# Patient Record
Sex: Female | Born: 1970
Health system: Southern US, Community
[De-identification: ages and names within clinical notes are randomized; demographics above are authoritative.]

## PROBLEM LIST (undated history)

## (undated) DIAGNOSIS — Z8371 Family history of colonic polyps: Secondary | ICD-10-CM

## (undated) DIAGNOSIS — Z83719 Family history of colon polyps, unspecified: Secondary | ICD-10-CM

## (undated) DIAGNOSIS — I10 Essential (primary) hypertension: Secondary | ICD-10-CM

## (undated) DIAGNOSIS — Z9889 Other specified postprocedural states: Secondary | ICD-10-CM

## (undated) DIAGNOSIS — O1420 HELLP syndrome (HELLP), unspecified trimester: Secondary | ICD-10-CM

## (undated) DIAGNOSIS — G43009 Migraine without aura, not intractable, without status migrainosus: Principal | ICD-10-CM

## (undated) DIAGNOSIS — K219 Gastro-esophageal reflux disease without esophagitis: Secondary | ICD-10-CM

## (undated) DIAGNOSIS — Z860100 Personal history of colon polyps, unspecified: Secondary | ICD-10-CM

## (undated) DIAGNOSIS — E559 Vitamin D deficiency, unspecified: Secondary | ICD-10-CM

## (undated) DIAGNOSIS — K635 Polyp of colon: Secondary | ICD-10-CM

## (undated) DIAGNOSIS — R112 Nausea with vomiting, unspecified: Secondary | ICD-10-CM

## (undated) HISTORY — DX: Family history of colonic polyps: Z83.71

## (undated) HISTORY — PX: OTHER SURGICAL HISTORY: SHX169

## (undated) HISTORY — DX: Personal history of colon polyps, unspecified: Z86.0100

## (undated) HISTORY — DX: Gastro-esophageal reflux disease without esophagitis: K21.9

## (undated) HISTORY — DX: Essential (primary) hypertension: I10

## (undated) HISTORY — DX: Migraine without aura, not intractable, without status migrainosus: G43.009

## (undated) HISTORY — DX: Vitamin D deficiency, unspecified: E55.9

## (undated) HISTORY — DX: Family history of colon polyps, unspecified: Z83.719

## (undated) HISTORY — DX: HELLP syndrome (HELLP), unspecified trimester: O14.20

## (undated) HISTORY — DX: Polyp of colon: K63.5

## (undated) HISTORY — DX: Personal history of colonic polyps: Z86.010

---

## 1998-03-23 HISTORY — PX: BREAST REDUCTION SURGERY: SHX8

## 1998-05-14 ENCOUNTER — Other Ambulatory Visit: Admission: RE | Admit: 1998-05-14 | Discharge: 1998-05-14 | Payer: Self-pay | Admitting: Plastic Surgery

## 2000-04-27 ENCOUNTER — Other Ambulatory Visit: Admission: RE | Admit: 2000-04-27 | Discharge: 2000-04-27 | Payer: Self-pay | Admitting: Obstetrics and Gynecology

## 2002-12-18 ENCOUNTER — Other Ambulatory Visit: Admission: RE | Admit: 2002-12-18 | Discharge: 2002-12-18 | Payer: Self-pay | Admitting: Gynecology

## 2004-01-04 ENCOUNTER — Other Ambulatory Visit: Admission: RE | Admit: 2004-01-04 | Discharge: 2004-01-04 | Payer: Self-pay | Admitting: Obstetrics and Gynecology

## 2005-04-09 ENCOUNTER — Other Ambulatory Visit: Admission: RE | Admit: 2005-04-09 | Discharge: 2005-04-09 | Payer: Self-pay | Admitting: Obstetrics and Gynecology

## 2005-07-09 ENCOUNTER — Ambulatory Visit: Payer: Self-pay | Admitting: Gastroenterology

## 2006-01-12 ENCOUNTER — Inpatient Hospital Stay (HOSPITAL_COMMUNITY): Admission: AD | Admit: 2006-01-12 | Discharge: 2006-01-12 | Payer: Self-pay | Admitting: Obstetrics and Gynecology

## 2006-02-01 ENCOUNTER — Ambulatory Visit (HOSPITAL_COMMUNITY): Admission: RE | Admit: 2006-02-01 | Discharge: 2006-02-01 | Payer: Self-pay | Admitting: Obstetrics and Gynecology

## 2006-04-13 ENCOUNTER — Other Ambulatory Visit: Admission: RE | Admit: 2006-04-13 | Discharge: 2006-04-13 | Payer: Self-pay | Admitting: Gynecology

## 2006-04-20 ENCOUNTER — Encounter: Admission: RE | Admit: 2006-04-20 | Discharge: 2006-04-20 | Payer: Self-pay | Admitting: Gynecology

## 2006-08-25 ENCOUNTER — Ambulatory Visit (HOSPITAL_COMMUNITY): Admission: RE | Admit: 2006-08-25 | Discharge: 2006-08-25 | Payer: Self-pay | Admitting: Obstetrics and Gynecology

## 2006-08-26 ENCOUNTER — Ambulatory Visit (HOSPITAL_COMMUNITY): Admission: RE | Admit: 2006-08-26 | Discharge: 2006-08-26 | Payer: Self-pay | Admitting: Obstetrics and Gynecology

## 2006-09-15 ENCOUNTER — Ambulatory Visit (HOSPITAL_COMMUNITY): Admission: RE | Admit: 2006-09-15 | Discharge: 2006-09-15 | Payer: Self-pay | Admitting: Obstetrics and Gynecology

## 2006-10-05 ENCOUNTER — Ambulatory Visit (HOSPITAL_COMMUNITY): Admission: RE | Admit: 2006-10-05 | Discharge: 2006-10-05 | Payer: Self-pay | Admitting: Obstetrics and Gynecology

## 2006-10-18 ENCOUNTER — Observation Stay (HOSPITAL_COMMUNITY): Admission: AD | Admit: 2006-10-18 | Discharge: 2006-10-19 | Payer: Self-pay | Admitting: Obstetrics and Gynecology

## 2006-10-19 ENCOUNTER — Encounter: Payer: Self-pay | Admitting: Obstetrics and Gynecology

## 2006-11-08 ENCOUNTER — Inpatient Hospital Stay (HOSPITAL_COMMUNITY): Admission: AD | Admit: 2006-11-08 | Discharge: 2006-11-16 | Payer: Self-pay | Admitting: Obstetrics & Gynecology

## 2006-11-09 ENCOUNTER — Encounter: Payer: Self-pay | Admitting: Obstetrics and Gynecology

## 2006-11-10 ENCOUNTER — Encounter: Payer: Self-pay | Admitting: Obstetrics & Gynecology

## 2006-11-12 ENCOUNTER — Encounter: Payer: Self-pay | Admitting: Obstetrics and Gynecology

## 2006-11-12 ENCOUNTER — Encounter (INDEPENDENT_AMBULATORY_CARE_PROVIDER_SITE_OTHER): Payer: Self-pay | Admitting: Obstetrics and Gynecology

## 2006-11-17 ENCOUNTER — Encounter: Admission: RE | Admit: 2006-11-17 | Discharge: 2006-12-17 | Payer: Self-pay | Admitting: Obstetrics and Gynecology

## 2006-12-18 ENCOUNTER — Encounter: Admission: RE | Admit: 2006-12-18 | Discharge: 2007-01-16 | Payer: Self-pay | Admitting: Obstetrics and Gynecology

## 2007-01-17 ENCOUNTER — Encounter: Admission: RE | Admit: 2007-01-17 | Discharge: 2007-02-16 | Payer: Self-pay | Admitting: Obstetrics and Gynecology

## 2007-02-17 ENCOUNTER — Encounter: Admission: RE | Admit: 2007-02-17 | Discharge: 2007-03-18 | Payer: Self-pay | Admitting: Obstetrics and Gynecology

## 2007-03-19 ENCOUNTER — Encounter: Admission: RE | Admit: 2007-03-19 | Discharge: 2007-04-18 | Payer: Self-pay | Admitting: Obstetrics and Gynecology

## 2007-04-19 ENCOUNTER — Encounter: Admission: RE | Admit: 2007-04-19 | Discharge: 2007-05-19 | Payer: Self-pay | Admitting: Obstetrics and Gynecology

## 2007-05-20 ENCOUNTER — Encounter: Admission: RE | Admit: 2007-05-20 | Discharge: 2007-06-16 | Payer: Self-pay | Admitting: Obstetrics and Gynecology

## 2007-06-17 ENCOUNTER — Encounter: Admission: RE | Admit: 2007-06-17 | Discharge: 2007-06-21 | Payer: Self-pay | Admitting: Obstetrics and Gynecology

## 2008-05-31 ENCOUNTER — Ambulatory Visit: Payer: Self-pay

## 2008-06-18 ENCOUNTER — Ambulatory Visit: Payer: Self-pay | Admitting: Cardiology

## 2008-06-18 ENCOUNTER — Encounter: Payer: Self-pay | Admitting: Cardiology

## 2008-06-18 DIAGNOSIS — R002 Palpitations: Secondary | ICD-10-CM

## 2008-06-18 DIAGNOSIS — I10 Essential (primary) hypertension: Secondary | ICD-10-CM | POA: Insufficient documentation

## 2009-01-29 ENCOUNTER — Ambulatory Visit (HOSPITAL_COMMUNITY): Admission: RE | Admit: 2009-01-29 | Discharge: 2009-01-29 | Payer: Self-pay | Admitting: Gynecology

## 2009-03-21 ENCOUNTER — Encounter: Admission: RE | Admit: 2009-03-21 | Discharge: 2009-03-21 | Payer: Self-pay | Admitting: Obstetrics and Gynecology

## 2010-02-12 ENCOUNTER — Ambulatory Visit (HOSPITAL_COMMUNITY)
Admission: RE | Admit: 2010-02-12 | Discharge: 2010-02-12 | Payer: Self-pay | Source: Home / Self Care | Admitting: Obstetrics and Gynecology

## 2010-08-05 NOTE — H&P (Signed)
NAMEALEXSIS, KATHMAN               ACCOUNT NO.:  [id number]   MEDICAL RECORD NO.:  [id number]          PATIENT TYPE:  INP   LOCATION:  9154                          FACILITY:  [facility]   PHYSICIAN:  Dineen Kid. Rana Snare, M.D.    DATE OF BIRTH:  Nov 09, 1970   DATE OF ADMISSION:  11/08/2006  DATE OF DISCHARGE:                              HISTORY & PHYSICAL   HISTORY OF PRESENT ILLNESS:  Ms. Schey is a 40 year old G1 at 59 5/7  weeks with estimated date of confinement of January 19, 2007, conceived  by intrauterine insemination.  She is being admitted for evaluation of  oligohydramnios.  The patient's pregnancy has been complicated by  multiple factors, most recently oligohydramnios admitted back on October 18, 2006.  It had resumed normal the next day after IV fluids.  She has  also been followed by the [facility]  for ambiguous genitalia with two normal amniocenteses in early  pregnancy, 15 weeks and also at 18 weeks, both showing normal female  karyotype.  She has had two weeks of lagging growth from early  ultrasounds throughout the pregnancy but also having four weeks lag as  far as femur length.  There has been interval growth throughout the  pregnancy, however,  and normal-appearing Dopplers previously and also  grade 1 placenta.  Since last week, she has also had some elevated blood  pressure.  Last week when she was seen in the office, her blood pressure  was 130/96; today it is 138/92.  She has negative protein.  She has been  on a modified bedrest, and she denies any preeclampsia symptoms.  More  specifically, no scotomata, right upper quadrant pain, or visual  disturbances.   PHYSICAL EXAMINATION:  VITAL SIGNS:  Blood pressure 138/92, urinalysis  negative.  Weight 182, which is down 1 pound.  Fetal heart rate is in  the 130s.  ABDOMEN:  Gravid, nontender.  PELVIC:  Her cervix on previous exam was long, closed, and high. She has  had sterile speculum exam  which has not shown any evidence of leaking.   Ultrasound today in the office shows AFI of 1.7 which is 10th  percentile, and breach presentation.   IMPRESSION:  1. Intrauterine pregnancy at 29 5/7 weeks, breach presentation, and      oligohydramnios at 9.7 AFI.  Plan:  IV fluid hydration,  Follow up      tomorrow with ultrasound.  Amniotic fluid as well as Doppler flow.  2. Elevated blood pressure without other symptomatology of      preeclampsia. Recommend 24-hour urine for creatinine clearance of      protein, serial blood pressures, repeat labs for preeclampsia, and      bedrest.  At this time, we will not give steroids as we will give      those when we feel delivery is imminent.  3. Ambiguous genitalia.  Normal chromosomes for XY.  4. Growth restriction 2 weeks lag by fetal weight, abdominal      circumference and biparietal diameter; four weeks lag by femur  length.      Dineen Kid Rana Snare, M.D.  Electronically Signed     DCL/MEDQ  D:  11/08/2006  T:  11/08/2006  Job:  [id number]

## 2010-08-05 NOTE — Discharge Summary (Signed)
NAMEKETTY, BITTON               ACCOUNT NO.:  [id number]   MEDICAL RECORD NO.:  [id number]          PATIENT TYPE:  INP   LOCATION:  9317                          [facility]ACILITY:  [facility]   PHYSICIAN:  Duke Salvia. Marcelle Overlie, M.D.DATE OF BIRTH:  1970-08-08   DATE OF ADMISSION:  11/08/2006  DATE OF DISCHARGE:  11/16/2006                               DISCHARGE SUMMARY   ADMITTING DIAGNOSES:  1. Intrauterine pregnancy at 63 and five-sevenths weeks estimated      gestational age.  2. Oligohydramnios.  3. Asymmetric growth restriction.  4. Elevated blood pressure.   DISCHARGE DIAGNOSES:  1. Status post low transverse cesarean section secondary to breech      presentation and hemolysis, elevated liver function, and low      platelet syndrome.  2. Viable female infant.   PROCEDURE:  Primary low transverse cesarean section.   REASON FOR ADMISSION:  Please see written H&P.   HOSPITAL COURSE:  The patient is 40 year old white married female  gravida 1, para 0 that was admitted to [facility] at 91  and five-sevenths weeks estimated gestational age for observation,  bedrest, IV fluids, 24-hour urine collection and careful monitoring.  The patient's pregnancy had been complicated by a 2-week lag in growth  of AC and HC and 4-week lag in growth of the fetal femur which was  suspicious for dwarfism.  Baby was also noted to have an ambiguous  genitalia with extensive workup.  The patient was known to have  oligohydramnios waxing and waning around the 10th percentile.  The  patient also had been seen in the office with noted elevation in blood  pressure with diastolic in the 90s.  The patient was now admitted for  observation and administration of betamethasone.  The amniotic fluid  index did improve slightly.  However, the fetus continued to have some  increase in systolic to diastolic ratio.  The morning of her delivery  the patient began to have some mid epigastric pain and blood  pressures  were in the 130s to 140s over 70 to 80 range.  The Doppler flow showed  there was some increased systolic/diastolic ratio of the fetus with no  end-diastolic flow.  Preeclampsia labs were drawn with the new  development of an increase in SGOT to 669 and SGPT of 439.  Platelets  were also noted to be dropping, initially at 149,000 and several hours  later down to 114,000 consistent with HELLP syndrome.  Because of this  and known breech presentation, decision was made to proceed with a  primary low transverse cesarean section.  The patient had completed her  betamethasone series earlier in the week.  The patient was then  transferred to the operating room where spinal anesthesia was  administered without difficulty.  A low transverse incision was made  with delivery of a viable female infant with ambiguous genitalia weighing  2 pounds 3 ounces with Apgars of 5 at one minute, 9 at five minutes.  Arterial cord pH was 7.33.  The patient tolerated procedure well and was  taken to the AICU for magnesium  administration and close monitoring.  On  the following morning the patient was without complaint.  Blood  pressures were in the 120s to 130s over 70s to 80s.  Deep tendon  reflexes were 1+.  The patient was noted to have a son output.  Fundus  was firm and nontender.  Abdominal dressing was noted to be clean, dry  and intact.  Laboratory findings revealed liver function tests that were  decreasing and platelets were now at 88,000.  Later that afternoon CBC  was redrawn and platelets were noted to be up to 94,000.  Urine output  was excellent.  On the following morning the patient was without  complaint.  Vital signs were stable.  Deep tendon reflexes were 1+.  Abdomen was soft.  The dressing was clean, dry and intact.  Platelets  were up to 104,000 and liver function tests were noted to be decreasing.  The following morning the patient was without complaint.  She did have a   temperature elevation to 100.5.  She was afebrile at the time of  rounding.  Blood pressure was 128/91.  Incision was clean, dry and  intact.  Fundus was firm.  She had good return of bowel function.  Platelet count was up to 145,000.  Liver function tests continued to be  improving.  On postoperative day #4 the patient was without complaint.  Vital signs were stable.  Blood pressure was now 113/79.  Lungs were  clear to auscultation.  Incision was clean, dry and intact.  AST was 51  and ALT was 59 and platelet count was 145,000.  The baby was being  transferred to the [facility] NICU and the patient will was  discharged home.   CONDITION ON DISCHARGE:  Stable.   DIET:  Regular as tolerated.   ACTIVITY:  No heavy lifting, no driving x2 weeks, no vaginal entry.   FOLLOW UP:  The patient to follow up in the office in 2-3 days for [facility]  labs and blood pressure check.  She is to call for headache, blurred  vision or right upper quadrant pain.  She is also to call for fever  greater than 100.5, redness or drainage from the incisional site, heavy  vaginal bleeding, or persistent nausea and vomiting.   DISCHARGE MEDICATIONS:  1. Tylox #30 one p.o. every 4-6 hours.  2. Motrin 600 mg every 6 hours.  3. Prenatal vitamins one p.o. daily.      Julio Sicks, N.P.      Richard M. Marcelle Overlie, M.D.  Electronically Signed    CC/MEDQ  D:  01/18/2007  T:  01/19/2007  Job:  [id number]

## 2010-08-05 NOTE — Consult Note (Signed)
Christy Porter, Christy Porter NO.:  [id number]   MEDICAL RECORD NO.:  [id number]          PATIENT TYPE:  OUT   LOCATION:  MFM                           [facility]:  [facility]   PHYSICIAN:  David Stall, M.D.DATE OF BIRTH:  03-Apr-1970   DATE OF CONSULTATION:  11/11/2006  DATE OF DISCHARGE:                                 CONSULTATION   PEDIATRIC AND ADULT ENDOCRINE CONSULTATION   SOURCE OF CONSULTATION:  Dr.'s Candice Camp and Wallace Keller   CHIEF COMPLAINT:  Please evaluate this 40 year old pregnant woman, who  has a fetus that by karyotype is 64 XY female, but has genitalia on  ultrasound that appear female or ambiguous.   HISTORY OF PRESENT ILLNESS:  The patient was interviewed and examined,  in the presence of her husband.  1. The patient was artificially inseminated using her husband's sperm,      after years of infertility.  She stated that she had a gluteal      phase defect.  2. An early screening test suggested the possibility of Down's      syndrome.  She, therefore, had an amniocentesis performed.  Prior      to the amniocentesis, the ultrasound suggested female genitalia.      Amniocentesis showed a 63 XY chromosome genotype.  Because of the      issues of possible ambiguity, other genetic tests were performed,      and a repeat amniocentesis was performed.  Repeat amniocentesis      again showed a 30 XY karyotype.  Subsequent gene sequencing for the      WT-1 gene, SRY gene, and the androgen receptor gene, were all      normal.  The patient has been told by the [facility] staff that they are      now seeing a small mound that might represent genital structures.      The patient already has already had consultations with perinatal      medicine, genetics at [facility], genetics at [facility] (Dr. Lendon Colonel, and maternal-fetal      medicine staff, and neonatology).  The patient has been given      parenteral steroids, to  prepare her in case she has early labor.   PAST MEDICAL HISTORY:  1. Medical:  The patient has been generally healthy.  She has had some      problems with acid reflux in the past.  She also has Raynaud's      phenomenon in the winter time.  2. Her only surgery has been a breast reduction procedure.  3. SHE HAS NO KNOWN DRUG ALLERGIES.  SHE ALSO HAS NO KNOWN ALLERGIES      TO OTHER ENVIRONMENTAL'S.   GYNECOLOGICAL HISTORY:  The patient is a gravida 1, para 0.  She had  menarche at age 23.  She had oligomenorrhea approximately 6 months, and  then fairly regular cycles.   SOCIAL HISTORY:  The patient is married.  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  She does use tobacco.  She drinks  socially or did  drink prior to the pregnancy.  She has not used illicit drugs.   FAMILY HISTORY:  There is no family history of thyroid disease or  diabetes.  She has a nephew who has been noted to have a relatively  small penis but not felt to be abnormal by the child's pediatrician.  There is no known family members who have had other birth defects.  On  the father's side of the family, he has a grandfather who had diabetes.  The same grandfather had myocardial infarction.   REVIEW OF SYSTEMS:  Review of systems has been positive for history of  migrainous headaches which occurred when she was taking oral  contraceptive.  These headaches have since resolved.  She wears contact  lenses.  Review of systems is negative for problems with ears, speech,  mouth, teeth, neck, heart, lungs, genitourinary tract, joints, muscles,  neurologic system, psychological problems, or mental problems.   PHYSICAL EXAMINATION:  VITAL SIGNS:  Temperature was 97.6, heart rate of  63, blood pressure 137/77.  GENERAL:  The patient was resting very comfortably in bed, chatting with  her husband and another family member when I entered.  She was alert and  oriented to person, place and time.  Her affect was completely  normal.  She is a very bright, intelligent woman.  She had a good sense of humor.  She did not appear overtly anxious or depressed.  She was in no acute  distress.  HEENT:  Eyes:  There was no arcus and no proptosis.  Mouth:  Showed a  normal oropharynx.  NECK:  There were no bruits.  There was also no tenderness.  I did not  appreciate a goiter.  CHEST:  Lungs are clear.  She moves air well.  HEART:  Sounds S1 and S2 are normal.  She has a grade 1/6 systolic  ejection murmur.  This is flow murmur, very consistent with this stage  of pregnancy.  ABDOMEN:  The abdomen is soft and nontender.  She has active bowel  sounds.  She has a gravid uterus.  EXTREMITIES:  Hands:  She has no tremor.  Her palms are normal.  Legs:  There is no edema.  Feet:  She has 1+ dorsalis pedis pulses.  ________ :  She has 5+ strength of her upper and lower extremities.  Sensation to touch in intact in her legs and her feet.   LABORATORY DATA:  On November 09, 2006, showed a serum calcium of 8.1 and  serum albumin of 2.2.  This was down from a calcium of 8.8 and a serum  albumin of 2.7 on November 08, 2006.  The hypocalcemia appears to be  secondary to hypoalbuminemia.  The patient does have a somewhat elevated  urine protein at 159 mg per 24 hours, but this would not normally  account for the high level of hyperalbuminemia.  I do not know if this  is consistent with this state of pregnancy or not.  Her creatinine  clearance is 146 mL per minute.  Hemoglobin was 11.2 and hematocrit  31.3.   ASSESSMENT:  1. The patient is a healthy, 40 year old pregnant woman.  She is      tolerating her pregnancy well at this time.  2. She has 30-week fetus with known 91, XY chromosomes and no obvious      genetic abnormalities of the WT-1 gene, SRY gene or androgen      receptor gene.  The  baby does have ambiguous genitalia, at least by      serial ultrasound exams.      a.     The differential diagnosis here includes many  items:          I. The child may be ________  normal 40, XY genetic female and              may end up having a perfectly normal female phenotype, but              the penis may not have been demonstrated adequately by              ultrasounds up to this time.          II.The patient may have a 19, XY genotype with complete sex              reversal of the external genitalia.  The fact that the              androgen receptor gene appears to be normal, would tend to              rule out complete androgen and sensitivity syndrome or              partial androgen and sensitivity syndrome.  There could be              other problems associated with a 5 alpha-reductase enzyme              abnormalities or with abnormalities within the adrenal              hormonal system to include 3B to HSD or 17-hydroxylase              abnormalities.          III.     The patient could have a 7 XY genotype, with truly              ambiguous genitalia that were associated with abnormal              gonads.  There could be one or more defects there.  The              baby could also have ambiguous genitalia as one aspect of              multiple defects and in one of the recognized syndromes,              for example Smith-Lemli-Opitz syndrome or others, or this              could be genital defects.  If present, could be the only              birth defects that the baby displayed.          IV.I met with the patient and her husband, and we talked for              almost two hours about the various pathophysiologic              possibilities, and the possible treatments for each.  We              assured her that we will take the proper steps, once the              baby is born, to fully evaluate the baby, to keep her and  her husband informed, and then to take the appropriate              further diagnostic therapeutic steps to take care of their              baby.  3. The patient has  oligohydramnios or borderline oligohydramnios.  If      this problem worsens, then the likelihood is that the fetus will      have to be delivered early.  The oligohydramnios increases the      possibility that the child may have some other birth defects.   PLAN:  1. Will continue to see the patient weekly for psychological support.  2. Will work with Dr. Erik Obey, the neonatologist, the obstetricians,      and the surgeons, to assess and treat the baby, when the baby is      born.  3. We have to recognize that we may need to refer the family to a      specialty center, if any of the diagnostic steps or therapeutic      steps after delivery become to complicated for [place] here at      [facility].           ______________________________  David Stall, M.D.     MJB/MEDQ  D:  11/11/2006  T:  11/13/2006  Job:  [id number]   cc:   Lendon Colonel, M.D.   James L. Alison Murray, M.D.  Fax: [phone]   Dineen Kid. Rana Snare, M.D.  Fax: [phone]   [facility]  [place], [place]   Felipa Eth, MD

## 2010-08-05 NOTE — Op Note (Signed)
Christy Porter, Christy Porter NO.:  [id number]   MEDICAL RECORD NO.:  [id number]          PATIENT TYPE:  OUT   LOCATION:  MFM                           [facility]   PHYSICIAN:  Dineen Kid. Rana Snare, M.D.    DATE OF BIRTH:  1970/06/21   DATE OF PROCEDURE:  11/12/2006  DATE OF DISCHARGE:                               OPERATIVE REPORT   PREOPERATIVE DIAGNOSIS:  Intrauterine pregnancy at 33 and 3/7 weeks,  intrauterine growth restriction, ambiguous genitalia, oligohydramnios,  severe preeclampsia with HELLP syndrome and breech presentation.   POSTOPERATIVE DIAGNOSIS:  Intrauterine pregnancy at 30 and 3/7 weeks,  intrauterine growth restriction, ambiguous genitalia, oligohydramnios,  severe preeclampsia with HELLP syndrome and breech presentation.   PROCEDURE:  Primary low segment transverse cesarean section.   SURGEON:  Dineen Kid. Rana Snare, M.D.   ANESTHESIA:  Spinal.   INDICATIONS:  Christy Porter is a 40 year old G1 who was originally admitted  to the hospital at 33 and 5/7 weeks with oligohydramnios who has also  been followed by the [facility] for  ambiguous genitalia and asymmetric intrauterine growth restriction.  She  was also admitted for elevated blood pressures which improved with  bedrest.  The amniotic fluid index improved slightly, however, the fetus  continued to have any increased systolic to diastolic ratios. This  morning, the patient began having midepigastric pain, blood pressures  remained in the 130 to 140 over 70 to 80 range. Doppler flow showed  increased systolic diastolic ratio of the fetus and also no end  diastolic flow. Pre-eclampsia labs showed a new development of increased  SGOT to 669, SGPT of 439, and platelets were dropping, this morning at  149,000, and at 1500, they were 114,000, consistent with HELLP syndrome.  Because of all of the above, we planned to proceed with delivery and  because of breech presentation,  planned primary low segment transverse  cesarean section.  The patient did receive steroid series earlier this  week.  The risks and benefits were discussed and informed consent was  obtained.   FINDINGS AT THE TIME OF SURGERY:  A viable fetus with ambiguous  genitalia, Apgars were 5 and 9, pH arterial 7.33, weight 2 pounds 3  ounces.   DESCRIPTION OF PROCEDURE:  After adequate analgesia, the patient was  placed in the supine position with a left lateral tilt.  She was  sterilely prepped and draped.  Her bladder was sterilely drained with a  Foley catheter in.  A Pfannenstiel skin incision was made 2  fingerbreadths above the pubic symphysis and taken down sharply to the  fascia which was incised transversely and extended superiorly and  inferiorly off the bellies of the rectus muscle which were separated  sharply in the midline.  The peritoneum was entered sharply.  The  bladder flap was created and placed behind the bladder blade.  A low  segment myotomy incision was made down to the amniotic sac and extended  bilaterally with the operator's fingertips.  Amniotomy was performed.  Footling breech was delivered, the arms easily reduced, the head  delivered.  Nares and pharynx were suctioned, the cord clamped and cut,  and baby was handed to pediatricians for resuscitation.  Cord blood was  obtained.  The placenta was extracted manually.  The uterus was  exteriorized, wiped clean with a dry lap.  The myotomy incision was  closed in three layers, the first being a locking layer, the second two  layers being imbricating layers of 0 Monocryl suture.  After good  hemostasis achieved, normal uterus, tubes and ovaries were noted.  The  uterus was placed back in the peritoneal cavity.  After copious amount  of irrigation and adequate hemostasis was assured, the peritoneum was  closed with 0 Monocryl, the rectus muscle plicated in the midline.  irrigation was applied. After adequate hemostasis,  the fascia was closed  with  #1 Vicryl.  Irrigation was again applied. After adequate  hemostasis, skin staples and Steri-Strips applied.  The patient  tolerated the procedure well, was stable on transfer to the recovery  room.  Sponge and instrument count was normal x3.  Estimated blood loss  was 850 mL.  The patient received 1 gram Rocephin after delivery of the  placenta.  The patient will continue on magnesium sulfate  postoperatively.      Dineen Kid Rana Snare, M.D.  Electronically Signed     DCL/MEDQ  D:  11/12/2006  T:  11/13/2006  Job:  [id number]

## 2010-08-05 NOTE — H&P (Signed)
Christy Porter, Christy Porter               ACCOUNT NO.:  [id number]   MEDICAL RECORD NO.:  [id number]          PATIENT TYPE:  INP   LOCATION:  9153                          FACILITY:  [facility]   PHYSICIAN:  Dineen Kid. Rana Snare, M.D.    DATE OF BIRTH:  [birth year]   DATE OF ADMISSION:  10/18/2006  DATE OF DISCHARGE:                              HISTORY & PHYSICAL   HISTORY OF PRESENT ILLNESS:  Christy Porter is a 40 year old G1 at 26-5/7th's  weeks who was seen today in the office for ultrasound evaluation.  She  had a low amniotic fluid with an AFI of 9.2-cm which is 4th percentile.  She had reported some intermittent vaginal leaking, underwent a pelvic  exam 4 days ago which had ruled out leaking causes at that time.  Actually reports the leaking had ceased.  Today she did undergo sterile  speculum exam but because of cervicitis unable to perform a fern test,  however, did have a negative Nitrazine.  Her estimated date of  confinement is January 19, 2007.  She has also been [facility] the  [facility] but cared for at  [facility] for fetus with ambiguous genitalia with a normal XY  karyotype.  She also has had some mild symmetric growth delay.  This  pregnancy also was a product of an intrauterine insemination and again  did have normal chromosome evaluation.   PAST MEDICAL HISTORY:  Negative.   PAST SURGICAL HISTORY:  She had breast reduction in 2000.   MEDICATIONS:  Currently, on none.   ALLERGIES:  ERYTHROMYCIN.   PHYSICAL EXAMINATION:  VITAL SIGNS:  Her blood pressure is 100/60.  HEART:  Regular rate and rhythm.  LUNGS:  Clear to auscultation bilaterally.  ABDOMEN:  Nondistended, nontender.  PELVIC:  She does have severe cervicitis which on pelvic exam had  moderate amount of bleeding which was controlled with Monsel solution.  Cervix visually appeared to be closed, thick, and high.   Nitrazine and Valsalva were negative as was pool.   IMPRESSION  AND PLAN:  Oligohydramnios with question of leaking history  was positive a week ago but negative exam and actually now has not been  having evidence of leaking.  We will place her on intravenous fluids as  well as intravenous antibiotics with intravenous Unasyn and re-  evaluation tomorrow by the maternal fetal medicine  specialist for evaluation of the fluid.  If it does appear that it most  likely represents rupture or if there is continued low fluid or poor  growth, consider giving steroids, otherwise at this time we will admit  for 23-hour observation until further clarification if this represents  just simply oligohydramnios or preterm rupture of membranes.      Dineen Kid Rana Snare, M.D.  Electronically Signed     DCL/MEDQ  D:  10/18/2006  T:  10/18/2006  Job:  [id number]

## 2010-08-23 ENCOUNTER — Inpatient Hospital Stay (HOSPITAL_COMMUNITY)
Admission: EM | Admit: 2010-08-23 | Discharge: 2010-08-24 | Disposition: A | Discharge status: Home / Self Care | Payer: BC Managed Care – PPO | Source: Ambulatory Visit | Attending: Obstetrics and Gynecology | Admitting: Obstetrics and Gynecology

## 2010-08-23 ENCOUNTER — Inpatient Hospital Stay (HOSPITAL_COMMUNITY): Payer: BC Managed Care – PPO

## 2010-08-23 DIAGNOSIS — S3981XA Other specified injuries of abdomen, initial encounter: Secondary | ICD-10-CM | POA: Insufficient documentation

## 2010-08-23 DIAGNOSIS — W1809XA Striking against other object with subsequent fall, initial encounter: Secondary | ICD-10-CM | POA: Insufficient documentation

## 2010-08-23 DIAGNOSIS — O99891 Other specified diseases and conditions complicating pregnancy: Secondary | ICD-10-CM

## 2010-08-23 DIAGNOSIS — O9989 Other specified diseases and conditions complicating pregnancy, childbirth and the puerperium: Secondary | ICD-10-CM

## 2010-08-23 LAB — CBC
HCT: 32.5 % — ABNORMAL LOW (ref 36.0–46.0)
Hemoglobin: 11.2 g/dL — ABNORMAL LOW (ref 12.0–15.0)
WBC: 9.1 10*3/uL (ref 4.0–10.5)

## 2010-08-24 LAB — KLEIHAUER-BETKE STAIN: Quantitation Fetal Hemoglobin: 0 mL

## 2010-09-05 ENCOUNTER — Observation Stay (HOSPITAL_COMMUNITY)
Admission: AD | Admit: 2010-09-05 | Discharge: 2010-09-07 | DRG: 379 | Disposition: A | Discharge status: Home / Self Care | Payer: BC Managed Care – PPO | Source: Ambulatory Visit | Attending: Obstetrics and Gynecology | Admitting: Obstetrics and Gynecology

## 2010-09-05 DIAGNOSIS — O4700 False labor before 37 completed weeks of gestation, unspecified trimester: Principal | ICD-10-CM | POA: Diagnosis present

## 2010-09-06 LAB — GLUCOSE, CAPILLARY
Glucose-Capillary: 139 mg/dL — ABNORMAL HIGH (ref 70–99)
Glucose-Capillary: 147 mg/dL — ABNORMAL HIGH (ref 70–99)
Glucose-Capillary: 149 mg/dL — ABNORMAL HIGH (ref 70–99)
Glucose-Capillary: 152 mg/dL — ABNORMAL HIGH (ref 70–99)

## 2010-09-07 LAB — GLUCOSE, CAPILLARY: Glucose-Capillary: 154 mg/dL — ABNORMAL HIGH (ref 70–99)

## 2010-09-10 ENCOUNTER — Encounter: Payer: BC Managed Care – PPO | Attending: Obstetrics and Gynecology

## 2010-09-10 DIAGNOSIS — O99810 Abnormal glucose complicating pregnancy: Secondary | ICD-10-CM | POA: Insufficient documentation

## 2010-09-10 DIAGNOSIS — Z713 Dietary counseling and surveillance: Secondary | ICD-10-CM | POA: Insufficient documentation

## 2010-09-10 NOTE — H&P (Signed)
  NAMEIDORA, BROSIOUS               ACCOUNT NO.:  [id number]  MEDICAL RECORD NO.:  [id number]  LOCATION:  9158                          [facility]ACILITY:  [facility]  PHYSICIAN:  Duke Salvia. Marcelle Overlie, M.D.DATE OF BIRTH:  02-Oct-1970  DATE OF ADMISSION:  09/05/2010 DATE OF DISCHARGE:  09/07/2010                             HISTORY & PHYSICAL   CHIEF COMPLAINT:  __________.  __________ complaining of increased pelvic pressure.  Ultrasound shows cervical length __________  positive.  She had a __________ with inpatient management for steroid __________.  PAST MEDICAL HISTORY:  Significant for prior cesarean section for HELLP syndrome and a history of infertility.  Please see the [facility] form for details.  PHYSICAL EXAMINATION:  VITAL SIGNS:  Temp 98.2, blood pressure 120/72. HEENT:  Unremarkable. NECK:  Supple without masses. LUNGS:  Clear. CARDIOVASCULAR:  Regular rate and rhythm without murmurs, rubs, or gallops. BREASTS:  Not examined. PELVIC:  A 38 cm fundal height.  Fetal heart rate was 140.  Cervix long and closed. EXTREMITIES/NEUROLOGIC:  Unremarkable.  IMPRESSION: 1. Prior C-section for hemolysis, elevated liver enzymes, low     platelets syndrome. 2. Positive fetal fibronectin.  PLAN:  We will admit for IV magnesium sulfate and betamethasone.     Benecio Kluger M. Marcelle Overlie, M.D.     RMH/MEDQ  D:  09/07/2010  T:  09/07/2010  Job:  [id number]  Electronically Signed by Richarda Overlie M.D. on 09/10/2010 09:09:31 AM

## 2010-10-23 ENCOUNTER — Encounter (HOSPITAL_COMMUNITY)
Admission: RE | Admit: 2010-10-23 | Discharge: 2010-10-23 | Disposition: A | Discharge status: Home / Self Care | Payer: BC Managed Care – PPO | Source: Ambulatory Visit | Attending: Obstetrics and Gynecology | Admitting: Obstetrics and Gynecology

## 2010-10-23 ENCOUNTER — Encounter (HOSPITAL_COMMUNITY): Payer: Self-pay

## 2010-10-23 LAB — BASIC METABOLIC PANEL
BUN: 9 mg/dL (ref 6–23)
Calcium: 8.6 mg/dL (ref 8.4–10.5)
Chloride: 100 mEq/L (ref 96–112)
Creatinine, Ser: 0.62 mg/dL (ref 0.50–1.10)
GFR calc Af Amer: >60 mL/min (ref >60)

## 2010-10-23 LAB — CBC
MCHC: 34.3 g/dL (ref 30.0–36.0)
Platelets: 230 10*3/uL (ref 150–400)
RDW: 13.4 % (ref 11.5–15.5)
WBC: 8.6 10*3/uL (ref 4.0–10.5)

## 2010-10-23 LAB — SURGICAL PCR SCREEN
MRSA, PCR: NEGATIVE
Staphylococcus aureus: NEGATIVE

## 2010-10-23 NOTE — Pre-Procedure Instructions (Signed)
Potassium of 3 called to Brayton Caves MD (anes). Order given to call patient and advise intake of Bananas.  Patient called and instructed.  Patton Salles, RN

## 2010-10-23 NOTE — Anesthesia Preprocedure Evaluation (Addendum)
Anesthesia Evaluation  Name, MR# and DOB Patient awake  General Assessment Comment  Reviewed: Allergy & Precautions, H&P  and Patient's Chart, lab work & pertinent test results  Airway Mallampati: II TM Distance: >3 FB Neck ROM: Full    Dental No notable dental hx (+)    Pulmonaryneg pulmonary ROS    clear to auscultation  pulmonary exam normal   Cardiovascular Regular Normal   Neuro/PsychNegative Neurological ROS Negative Psych ROS  GI/Hepatic/Renal negative GI ROS, negative Liver ROS, and negative Renal ROS (+)       Endo/Other  Negative Endocrine ROS (+)   Abdominal   Musculoskeletal negative musculoskeletal ROS (+)  Hematology negative hematology ROS (+)   Peds  Reproductive/Obstetrics (+) Pregnancy   Anesthesia Other Findings             Anesthesia Physical Anesthesia Plan  ASA: II  Anesthesia Plan: Spinal   Post-op Pain Management:    Induction:   Airway Management Planned:   Additional Equipment:   Intra-op Plan:   Post-operative Plan:   Informed Consent: I have reviewed the patients History and Physical, chart, labs and discussed the procedure including the risks, benefits and alternatives for the proposed anesthesia with the patient or authorized representative who has indicated his/her understanding and acceptance.   Dental advisory given  Plan Discussed with: Anesthesiologist (AP) and CRNA  Anesthesia Plan Comments:         Anesthesia Quick Evaluation

## 2010-10-23 NOTE — Patient Instructions (Addendum)
20 Christy Porter  10/23/2010   Your procedure is scheduled on:  10/24/10  Report to [facility] at 6 AM.  Call this number if you have problems the morning of surgery: [phone]   Remember:   Do not eat food:After Midnight.  Do not drink clear liquids: After Midnight.  Take these medicines the morning of surgery with A SIP OF WATER: per anes   Do not wear jewelry, make-up or nail polish.  Do not wear lotions, powders, or perfumes. You may wear deodorant.  Do not shave 48 hours prior to surgery.  Do not bring valuables to the hospital.  Contacts, dentures or bridgework may not be worn into surgery.  Leave suitcase in the car. After surgery it may be brought to your room.  For patients admitted to the hospital, checkout time is 11:00 AM the day of discharge.   Patients discharged the day of surgery will not be allowed to drive home.  Name and phone number of your driver: NA  Special Instructions: use CHG wash per written instruction sheet  Please read over the following fact sheets that you were given: MRSA Information

## 2010-10-23 NOTE — H&P (Signed)
  H&P dictated #[id number] DL

## 2010-10-24 ENCOUNTER — Encounter (HOSPITAL_COMMUNITY): Payer: Self-pay | Admitting: Anesthesiology

## 2010-10-24 ENCOUNTER — Inpatient Hospital Stay (HOSPITAL_COMMUNITY)
Admission: RE | Admit: 2010-10-24 | Discharge: 2010-10-27 | DRG: 371 | Disposition: A | Discharge status: Home / Self Care | Payer: BC Managed Care – PPO | Source: Ambulatory Visit | Attending: Obstetrics and Gynecology | Admitting: Obstetrics and Gynecology

## 2010-10-24 ENCOUNTER — Encounter (HOSPITAL_COMMUNITY): Payer: Self-pay | Admitting: *Deleted

## 2010-10-24 ENCOUNTER — Inpatient Hospital Stay (HOSPITAL_COMMUNITY): Payer: BC Managed Care – PPO | Admitting: Anesthesiology

## 2010-10-24 ENCOUNTER — Encounter (HOSPITAL_COMMUNITY)
Admission: RE | Disposition: A | Discharge status: Home / Self Care | Payer: Self-pay | Source: Ambulatory Visit | Attending: Obstetrics and Gynecology

## 2010-10-24 DIAGNOSIS — O99814 Abnormal glucose complicating childbirth: Secondary | ICD-10-CM | POA: Diagnosis present

## 2010-10-24 DIAGNOSIS — Z01818 Encounter for other preprocedural examination: Secondary | ICD-10-CM

## 2010-10-24 DIAGNOSIS — O1002 Pre-existing essential hypertension complicating childbirth: Secondary | ICD-10-CM | POA: Diagnosis present

## 2010-10-24 DIAGNOSIS — I10 Essential (primary) hypertension: Secondary | ICD-10-CM

## 2010-10-24 DIAGNOSIS — Z302 Encounter for sterilization: Secondary | ICD-10-CM

## 2010-10-24 DIAGNOSIS — Z01812 Encounter for preprocedural laboratory examination: Secondary | ICD-10-CM

## 2010-10-24 DIAGNOSIS — O34219 Maternal care for unspecified type scar from previous cesarean delivery: Principal | ICD-10-CM | POA: Diagnosis present

## 2010-10-24 DIAGNOSIS — R002 Palpitations: Secondary | ICD-10-CM

## 2010-10-24 LAB — ABO/RH

## 2010-10-24 LAB — GLUCOSE, CAPILLARY: Glucose-Capillary: 94 mg/dL (ref 70–99)

## 2010-10-24 SURGERY — Surgical Case
Anesthesia: Spinal | Wound class: Clean Contaminated

## 2010-10-24 MED ORDER — FENTANYL CITRATE 0.05 MG/ML IJ SOLN: 25.0000 ug | INTRAMUSCULAR | Status: DC | PRN

## 2010-10-24 MED ORDER — ONDANSETRON HCL 4 MG/2ML IJ SOLN
INTRAMUSCULAR | Status: DC | PRN
  Administered 2010-10-24: 4 mg via INTRAVENOUS

## 2010-10-24 MED ORDER — FENTANYL CITRATE 0.05 MG/ML IJ SOLN
INTRAMUSCULAR | Status: AC
  Filled 2010-10-24: qty 2

## 2010-10-24 MED ORDER — IBUPROFEN 600 MG PO TABS
600.0000 mg | ORAL_TABLET | Freq: Four times a day (QID) | ORAL | Status: DC | PRN
  Administered 2010-10-26: 600 mg via ORAL

## 2010-10-24 MED ORDER — PHENYLEPHRINE HCL 10 MG/ML IJ SOLN
INTRAMUSCULAR | Status: DC | PRN
  Administered 2010-10-24: 80 ug via INTRAVENOUS
  Administered 2010-10-24: 40 ug via INTRAVENOUS
  Administered 2010-10-24 (×5): 80 ug via INTRAVENOUS

## 2010-10-24 MED ORDER — MORPHINE SULFATE 0.5 MG/ML IJ SOLN
INTRAMUSCULAR | Status: AC
  Filled 2010-10-24: qty 10

## 2010-10-24 MED ORDER — ZOLPIDEM TARTRATE 5 MG PO TABS: 5.0000 mg | ORAL_TABLET | Freq: Every evening | ORAL | Status: DC | PRN

## 2010-10-24 MED ORDER — FENTANYL CITRATE 0.05 MG/ML IJ SOLN
INTRAMUSCULAR | Status: DC | PRN
  Administered 2010-10-24: 25 ug via INTRATHECAL

## 2010-10-24 MED ORDER — MEPERIDINE HCL 25 MG/ML IJ SOLN: 6.2500 mg | INTRAMUSCULAR | Status: DC | PRN

## 2010-10-24 MED ORDER — NALOXONE HCL 0.4 MG/ML IJ SOLN: 0.4000 mg | INTRAMUSCULAR | Status: DC | PRN

## 2010-10-24 MED ORDER — KETOROLAC TROMETHAMINE 60 MG/2ML IM SOLN
INTRAMUSCULAR | Status: AC
  Administered 2010-10-24: 60 mg via INTRAMUSCULAR
  Filled 2010-10-24: qty 2

## 2010-10-24 MED ORDER — METOCLOPRAMIDE HCL 5 MG/ML IJ SOLN: 10.0000 mg | Freq: Three times a day (TID) | INTRAMUSCULAR | Status: DC | PRN

## 2010-10-24 MED ORDER — KETOROLAC TROMETHAMINE 60 MG/2ML IM SOLN
60.0000 mg | Freq: Once | INTRAMUSCULAR | Status: AC | PRN
  Administered 2010-10-24: 60 mg via INTRAMUSCULAR

## 2010-10-24 MED ORDER — CEFOTETAN DISODIUM 1 G IJ SOLR
1.0000 g | INTRAMUSCULAR | Status: AC
  Administered 2010-10-24: 1 g via INTRAVENOUS
  Filled 2010-10-24: qty 1

## 2010-10-24 MED ORDER — SODIUM CHLORIDE 0.9 % IV SOLN: 1.0000 ug/kg/h | INTRAVENOUS | Status: DC | PRN

## 2010-10-24 MED ORDER — DIPHENHYDRAMINE HCL 50 MG/ML IJ SOLN: 25.0000 mg | INTRAMUSCULAR | Status: DC | PRN

## 2010-10-24 MED ORDER — MENTHOL 3 MG MT LOZG
1.0000 | LOZENGE | OROMUCOSAL | Status: DC | PRN
  Filled 2010-10-24: qty 9

## 2010-10-24 MED ORDER — NALBUPHINE SYRINGE 5 MG/0.5 ML
5.0000 mg | INJECTION | INTRAMUSCULAR | Status: DC | PRN
  Filled 2010-10-24: qty 1

## 2010-10-24 MED ORDER — LANOLIN HYDROUS EX OINT: 1.0000 "application " | TOPICAL_OINTMENT | CUTANEOUS | Status: DC | PRN

## 2010-10-24 MED ORDER — ONDANSETRON HCL 4 MG/2ML IJ SOLN
INTRAMUSCULAR | Status: AC
  Filled 2010-10-24: qty 2

## 2010-10-24 MED ORDER — EPHEDRINE SULFATE 50 MG/ML IJ SOLN
INTRAMUSCULAR | Status: DC | PRN
  Administered 2010-10-24: 5 mg via INTRAVENOUS
  Administered 2010-10-24: 10 mg via INTRAVENOUS

## 2010-10-24 MED ORDER — DIPHENHYDRAMINE HCL 25 MG PO CAPS
25.0000 mg | ORAL_CAPSULE | Freq: Four times a day (QID) | ORAL | Status: DC | PRN
  Filled 2010-10-24: qty 1

## 2010-10-24 MED ORDER — EPHEDRINE 5 MG/ML INJ
INTRAVENOUS | Status: AC
  Filled 2010-10-24: qty 10

## 2010-10-24 MED ORDER — ONDANSETRON HCL 4 MG/2ML IJ SOLN: 4.0000 mg | INTRAMUSCULAR | Status: DC | PRN

## 2010-10-24 MED ORDER — ONDANSETRON HCL 4 MG PO TABS: 4.0000 mg | ORAL_TABLET | ORAL | Status: DC | PRN

## 2010-10-24 MED ORDER — MORPHINE SULFATE (PF) 0.5 MG/ML IJ SOLN
INTRAMUSCULAR | Status: DC | PRN
Start: 2010-10-24 — End: 2010-10-24
  Administered 2010-10-24: .15 mg via INTRATHECAL

## 2010-10-24 MED ORDER — SODIUM CHLORIDE 0.9 % IJ SOLN: 3.0000 mL | INTRAMUSCULAR | Status: DC | PRN

## 2010-10-24 MED ORDER — OXYTOCIN 10 UNIT/ML IJ SOLN
INTRAMUSCULAR | Status: DC | PRN
  Administered 2010-10-24 (×2): 20 [IU]

## 2010-10-24 MED ORDER — BUPIVACAINE IN DEXTROSE 0.75-8.25 % IT SOLN
INTRATHECAL | Status: DC | PRN
  Administered 2010-10-24: 2 mL via INTRATHECAL

## 2010-10-24 MED ORDER — PRENATAL PLUS 27-1 MG PO TABS
1.0000 | ORAL_TABLET | Freq: Every day | ORAL | Status: DC
  Administered 2010-10-24 – 2010-10-27 (×4): 1 via ORAL
  Filled 2010-10-24 (×6): qty 1

## 2010-10-24 MED ORDER — OXYCODONE-ACETAMINOPHEN 5-325 MG PO TABS
1.0000 | ORAL_TABLET | ORAL | Status: DC | PRN
  Administered 2010-10-25 (×2): 1 via ORAL
  Administered 2010-10-26: 2 via ORAL
  Administered 2010-10-26 (×3): 1 via ORAL
  Administered 2010-10-27: 2 via ORAL
  Filled 2010-10-24 (×3): qty 1
  Filled 2010-10-24: qty 2
  Filled 2010-10-24 (×2): qty 1
  Filled 2010-10-24: qty 2

## 2010-10-24 MED ORDER — LACTATED RINGERS IV SOLN
INTRAVENOUS | Status: DC
  Administered 2010-10-24 (×6): via INTRAVENOUS

## 2010-10-24 MED ORDER — SIMETHICONE 80 MG PO CHEW: 80.0000 mg | CHEWABLE_TABLET | ORAL | Status: DC | PRN

## 2010-10-24 MED ORDER — HYDROCHLOROTHIAZIDE 25 MG PO TABS
25.0000 mg | ORAL_TABLET | Freq: Every day | ORAL | Status: DC
  Administered 2010-10-24 – 2010-10-27 (×4): 25 mg via ORAL
  Filled 2010-10-24 (×4): qty 1

## 2010-10-24 MED ORDER — SCOPOLAMINE 1 MG/3DAYS TD PT72
MEDICATED_PATCH | TRANSDERMAL | Status: AC
  Administered 2010-10-24: 1.5 mg
  Filled 2010-10-24: qty 1

## 2010-10-24 MED ORDER — OXYTOCIN 10 UNIT/ML IJ SOLN
INTRAMUSCULAR | Status: AC
  Filled 2010-10-24: qty 4

## 2010-10-24 MED ORDER — SENNOSIDES-DOCUSATE SODIUM 8.6-50 MG PO TABS
2.0000 | ORAL_TABLET | Freq: Every day | ORAL | Status: DC
  Administered 2010-10-24 – 2010-10-26 (×3): 2 via ORAL

## 2010-10-24 MED ORDER — SIMETHICONE 80 MG PO CHEW
80.0000 mg | CHEWABLE_TABLET | Freq: Three times a day (TID) | ORAL | Status: DC
  Administered 2010-10-24 – 2010-10-27 (×10): 80 mg via ORAL

## 2010-10-24 MED ORDER — PHENYLEPHRINE 40 MCG/ML (10ML) SYRINGE FOR IV PUSH (FOR BLOOD PRESSURE SUPPORT)
PREFILLED_SYRINGE | INTRAVENOUS | Status: AC
  Filled 2010-10-24: qty 15

## 2010-10-24 MED ORDER — TETANUS-DIPHTH-ACELL PERTUSSIS 5-2.5-18.5 LF-MCG/0.5 IM SUSP
0.5000 mL | Freq: Once | INTRAMUSCULAR | Status: DC
  Filled 2010-10-24: qty 0.5

## 2010-10-24 MED ORDER — KETOROLAC TROMETHAMINE 30 MG/ML IJ SOLN: 30.0000 mg | Freq: Four times a day (QID) | INTRAMUSCULAR | Status: AC | PRN

## 2010-10-24 MED ORDER — DIPHENHYDRAMINE HCL 50 MG/ML IJ SOLN: 12.5000 mg | INTRAMUSCULAR | Status: DC | PRN

## 2010-10-24 MED ORDER — OXYTOCIN 20 UNITS IN LACTATED RINGERS INFUSION - SIMPLE
125.0000 mL/h | INTRAVENOUS | Status: AC
  Administered 2010-10-24: 500 mL/h via INTRAVENOUS
  Administered 2010-10-24: 125 mL/h via INTRAVENOUS
  Filled 2010-10-24: qty 1000

## 2010-10-24 MED ORDER — IBUPROFEN 600 MG PO TABS
600.0000 mg | ORAL_TABLET | Freq: Four times a day (QID) | ORAL | Status: DC
  Administered 2010-10-24 – 2010-10-27 (×10): 600 mg via ORAL
  Filled 2010-10-24 (×11): qty 1

## 2010-10-24 SURGICAL SUPPLY — 30 items
CLIP FILSHIE TUBAL LIGA STRL (Clip) ×2 IMPLANT
CLOTH BEACON ORANGE TIMEOUT ST (SAFETY) ×2 IMPLANT
DRESSING TELFA 8X3 (GAUZE/BANDAGES/DRESSINGS) IMPLANT
DRSG COVADERM 4X8 (GAUZE/BANDAGES/DRESSINGS) ×2 IMPLANT
ELECT REM PT RETURN 9FT ADLT (ELECTROSURGICAL) ×2
ELECTRODE REM PT RTRN 9FT ADLT (ELECTROSURGICAL) ×1 IMPLANT
EXTRACTOR VACUUM M CUP 4 TUBE (SUCTIONS) IMPLANT
GAUZE SPONGE 4X4 12PLY STRL LF (GAUZE/BANDAGES/DRESSINGS) IMPLANT
GLOVE BIO SURGEON STRL SZ8 (GLOVE) ×2 IMPLANT
GLOVE SURG ORTHO 8.0 STRL STRW (GLOVE) ×2 IMPLANT
GOWN PREVENTION PLUS LG XLONG (DISPOSABLE) ×2 IMPLANT
GOWN STRL REIN XL XLG (GOWN DISPOSABLE) ×2 IMPLANT
KIT ABG SYR 3ML LUER SLIP (SYRINGE) ×2 IMPLANT
NEEDLE HYPO 25X5/8 SAFETYGLIDE (NEEDLE) ×2 IMPLANT
NS IRRIG 1000ML POUR BTL (IV SOLUTION) ×2 IMPLANT
PACK C SECTION WH (CUSTOM PROCEDURE TRAY) ×2 IMPLANT
PAD ABD 7.5X8 STRL (GAUZE/BANDAGES/DRESSINGS) IMPLANT
SLEEVE SCD COMPRESS KNEE MED (MISCELLANEOUS) IMPLANT
STAPLER VISISTAT 35W (STAPLE) IMPLANT
SUT MNCRL 0 VIOLET CTX 36 (SUTURE) ×4 IMPLANT
SUT MONOCRYL 0 CTX 36 (SUTURE) ×4
SUT PDS AB 1 CT  36 (SUTURE)
SUT PDS AB 1 CT 36 (SUTURE) IMPLANT
SUT VIC AB 0 CTX 36 (SUTURE) ×1
SUT VIC AB 0 CTX36XBRD ANBCTRL (SUTURE) ×1 IMPLANT
SUT VIC AB 1 CTX 36 (SUTURE) ×1
SUT VIC AB 1 CTX36XBRD ANBCTRL (SUTURE) ×1 IMPLANT
TOWEL OR 17X24 6PK STRL BLUE (TOWEL DISPOSABLE) ×4 IMPLANT
TRAY FOLEY CATH 14FR (SET/KITS/TRAYS/PACK) ×2 IMPLANT
WATER STERILE IRR 1000ML POUR (IV SOLUTION) ×2 IMPLANT

## 2010-10-24 NOTE — Anesthesia Postprocedure Evaluation (Signed)
  Anesthesia Post-op Note  Patient: Christy Porter  Procedure(s) Performed:  CESAREAN SECTION WITH BILATERAL TUBAL LIGATION - Repeat Cesarian Section; Delivery of Baby Girl @ [phone]; Apgars  9/9; Bilateral Filshie Clips Tubal Ligations  Patient Location: Mother/Baby  Anesthesia Type: Spinal  Level of Consciousness: awake, alert  and oriented  Airway and Oxygen Therapy: Patient Spontanous Breathing  Post-op Pain: mild  Post-op Assessment: Patient's Cardiovascular Status Stable and Respiratory Function Stable  Post-op Vital Signs: Reviewed and stable  Complications: No apparent anesthesia complications

## 2010-10-24 NOTE — Consult Note (Signed)
Called to attend term gestation repeat C/S under spinal anesthesia.  ROM at delivery. INfant in vertex presentation with a loose nuchal cord x 1, manually reduced.  Given tactile stimulation with drying and bulb suction of naso/oropharynx. No dystmorphic features.  Normal transition.   Judith Blonder MD [facility]

## 2010-10-24 NOTE — Anesthesia Procedure Notes (Addendum)
Spinal Block  Patient location during procedure: OR Start time: 10/24/2010 7:43 AM Staffing Anesthesiologist: FOSTER, MICHAEL A. Performed by: anesthesiologist  Preanesthetic Checklist Completed: patient identified, site marked, surgical consent, pre-op evaluation, timeout performed, IV checked, risks and benefits discussed and monitors and equipment checked Spinal Block Patient position: sitting Prep: site prepped and draped and DuraPrep Patient monitoring: heart rate, cardiac monitor, continuous pulse ox and blood pressure Approach: midline Location: L3-4 Injection technique: single-shot Needle Needle type: Sprotte  Needle gauge: 24 G Needle length: 9 cm Needle insertion depth: 4 cm Assessment Sensory level: T4 Additional Notes Pt. Tolerated procedure well. Adequate surgical anesthesia level.

## 2010-10-24 NOTE — Progress Notes (Signed)
[facility] INFORMATION GIVEN TO PATIENT.  PATIENT DESIRES TO PUMP AND BOTTLEFEED.  PUMP RENTAL PAPERS GIVEN.  DESIRES RENTAL AT [place],

## 2010-10-24 NOTE — Transfer of Care (Signed)
Immediate Anesthesia Transfer of Care Note  Patient: Christy Porter  Procedure(s) Performed:  CESAREAN SECTION WITH BILATERAL TUBAL LIGATION - Repeat Cesarian Section; Delivery of Baby Girl @ [phone]; Apgars  9/9; Bilateral Filshie Clips Tubal Ligations  Patient Location: PACU  Anesthesia Type: Spinal  Level of Consciousness: awake, alert  and oriented  Airway & Oxygen Therapy: Patient Spontanous Breathing  Post-op Assessment: Report given to PACU RN and Post -op Vital signs reviewed and stable  Post vital signs: Reviewed and stable  Complications: No apparent anesthesia complications

## 2010-10-24 NOTE — Procedures (Signed)
Cesarean Section Procedure Note  Pre-operative Diagnosis: 37 week 5 day pregnancy. prev C/S, desires sterility, mautre amnio, Gest diabetes, chronic htn  Post-operative Diagnosis: same  Surgeon: Turner Daniels   Assistants: none  Anesthesia: Spinal anesthesia  Procedure Details  The patient was seen in the Holding Room. The risks, benefits, complications, treatment options, and expected outcomes were discussed with the patient.  The patient concurred with the proposed plan, giving informed consent.  The site of surgery properly noted/marked.. A Time Out was held and the above information confirmed.  After induction of anesthesia, the patient was draped and prepped in the usual sterile manner. A Pfannenstiel incision was made and carried down through the subcutaneous tissue to the fascia. Fascial incision was made and extended transversely. The fascia was separated from the underlying rectus tissue superiorly and inferiorly. The peritoneum was identified and entered. Peritoneal incision was extended longitudinally. The utero-vesical peritoneal reflection was incised transversely and the bladder flap was bluntly freed from the lower uterine segment. A low transverse uterine incision was made. Delivered from vertex presentation was a with Apgar scores of 9 at one minute and 9 at five minutes. After the umbilical cord was clamped and cut cord blood was obtained for evaluation. The placenta was removed intact and appeared normal. The uterine outline, tubes and ovaries appeared normal. The uterine incision was closed with running locked sutures of 0 monocryl and imbricated with 0 monocryl. Hemostasis was observed.Bilateral fallopian tubes were identified by their fimbriated ends and a filshie clip was placed in the midportion of each tube with good placement noted.Lavage was carried out until clear. The peritoneum was then closed with 0 monocryl and rectus muscles plicated in the midline.  After hemostasis was  assured, the fascia was then reapproximated with running sutures of 0 Vicryl. Irrigateion was applied and after adequate hemostasis was assured, the skin was reapproximated with staples.  Instrument, sponge, and needle counts were correct prior the abdominal closure and at the conclusion of the case. The patient received cefotetan preoperatively.  Findings:   Estimated Blood Loss:  350         Specimens: Placenta was sent to l&D         Complications:  None

## 2010-10-25 LAB — CBC
HCT: 29.8 % — ABNORMAL LOW (ref 36.0–46.0)
MCH: 34 pg (ref 26.0–34.0)
MCV: 100.3 fL — ABNORMAL HIGH (ref 78.0–100.0)
RDW: 13.6 % (ref 11.5–15.5)
WBC: 8.1 10*3/uL (ref 4.0–10.5)

## 2010-10-25 NOTE — Progress Notes (Signed)
Mom pumping and bottlefeeding EBM and formula Does not want to put baby to breast. Wants pump rental at [place]. No questions at present.

## 2010-10-25 NOTE — Progress Notes (Signed)
Subjective: Postpartum Day one: Cesarean Delivery Patient reports no problems voiding.    Objective: Vital signs in last 24 hours: Temp:  [97.3 F (36.3 C)-98.7 F (37.1 C)] 97.9 F (36.6 C) (08/04 0400) Pulse Rate:  [63-92] 65  (08/04 0400) Resp:  [14-20] 16  (08/04 0400) BP: (86-126)/(55-81) 106/72 mmHg (08/04 0400) SpO2:  [96 %-100 %] 97 % (08/04 0400) Weight:  [180 lb (81.647 kg)] 180 lb (81.647 kg) (08/03 1041)  Physical Exam:  General: alert Lochia: appropriate Uterine Fundus: firm Incision: dressing dry DVT Evaluation: No evidence of DVT seen on physical exam.   Basename 10/25/10 0517 10/23/10 1115  HGB 10.1* 12.0  HCT 29.8* 35.0*    Assessment/Plan: Status post Cesarean section. Doing well postoperatively.  Continue current care.  Christy Porter S 10/25/2010, 9:12 AM

## 2010-10-26 NOTE — Progress Notes (Signed)
Subjective: Postpartum Day two*: Cesarean Delivery Patient reports tolerating PO.    Objective: Vital signs in last 24 hours: Temp:  [97.7 F (36.5 C)-98.1 F (36.7 C)] 97.9 F (36.6 C) (08/05 0529) Pulse Rate:  [68-78] 69  (08/05 0529) Resp:  [18-20] 20  (08/05 0529) BP: (101-114)/(66-75) 114/75 mmHg (08/05 0529)  Physical Exam:  General: alert Lochia: appropriate Uterine Fundus: firm Incision: healing well DVT Evaluation: No evidence of DVT seen on physical exam.   Basename 10/25/10 0517 10/23/10 1115  HGB 10.1* 12.0  HCT 29.8* 35.0*    Assessment/Plan: Status post Cesarean section. Doing well postoperatively.  Continue current care.  Morgon Pamer S 10/26/2010, 7:17 AM

## 2010-10-27 LAB — GLUCOSE, CAPILLARY: Glucose-Capillary: 74 mg/dL (ref 70–99)

## 2010-10-27 MED ORDER — IBUPROFEN 600 MG PO TABS: 600.0000 mg | ORAL_TABLET | Freq: Four times a day (QID) | ORAL | Status: AC

## 2010-10-27 MED ORDER — OXYCODONE-ACETAMINOPHEN 5-325 MG PO TABS: 1.0000 | ORAL_TABLET | ORAL | Status: AC | PRN

## 2010-10-27 NOTE — Discharge Summary (Signed)
Obstetric Discharge Summary Reason for Admission: cesarean section Prenatal Procedures: amniocentesis Intrapartum Procedures: cesarean: low cervical, transverse Postpartum Procedures: none Complications-Operative and Postpartum: none Hemoglobin  Date Value Range Status  10/25/2010 10.1* 12.0-15.0 (g/dL) Final     HCT  Date Value Range Status  10/25/2010 29.8* 36.0-46.0 (%) Final    Discharge Diagnoses: Term Pregnancy-delivered  Discharge Information: Date: 10/27/2010 Activity: pelvic rest Diet: routine Medications: PNV, Ibuprophen and Percocet Condition: stable Instructions: refer to practice specific booklet Discharge to: home   Newborn Data: Live born female  Birth Weight: 6 lb 7.7 oz (2940 g) APGAR: 9, 9  Home with mother.  Christy Porter G 10/27/2010, 8:09 AM

## 2010-10-27 NOTE — Progress Notes (Signed)
Mothers breast full and firm. She is exclusively pumping and plans to rent electric pump. inst to call for assistance if needed.

## 2010-10-27 NOTE — Progress Notes (Cosign Needed)
Subjective: Postpartum Day 3: Cesarean Delivery Patient reports tolerating PO, + flatus and no problems voiding.    Objective: Vital signs in last 24 hours: Temp:  [98.5 F (36.9 C)-98.7 F (37.1 C)] 98.5 F (36.9 C) (08/06 0518) Pulse Rate:  [74-90] 74  (08/06 0518) Resp:  [18-19] 18  (08/06 0518) BP: (102-125)/(68-84) 115/77 mmHg (08/06 0518)  Physical Exam:  General: alert and cooperative Lochia: appropriate Uterine Fundus: firm Incision: healing well DVT Evaluation: No evidence of DVT seen on physical exam.   Basename 10/25/10 0517  HGB 10.1*  HCT 29.8*    Assessment/Plan: Status post Cesarean section. Doing well postoperatively.  Discharge home with standard precautions and return to clinic in 2 days for staple removal.  Kayliee Atienza G 10/27/2010, 7:54 AM

## 2010-11-18 ENCOUNTER — Encounter: Payer: Self-pay | Admitting: Vascular Surgery

## 2010-11-21 ENCOUNTER — Encounter: Payer: Self-pay | Admitting: Vascular Surgery

## 2010-11-25 ENCOUNTER — Ambulatory Visit (INDEPENDENT_AMBULATORY_CARE_PROVIDER_SITE_OTHER): Payer: BC Managed Care – PPO | Admitting: Vascular Surgery

## 2010-11-25 ENCOUNTER — Encounter: Payer: Self-pay | Admitting: Vascular Surgery

## 2010-11-25 VITALS — BP 119/84 | HR 72 | Resp 20 | Ht 69.0 in | Wt 159.0 lb

## 2010-11-25 DIAGNOSIS — I83893 Varicose veins of bilateral lower extremities with other complications: Secondary | ICD-10-CM

## 2010-11-25 NOTE — Progress Notes (Signed)
Photos were taken and are archived.

## 2010-11-25 NOTE — Progress Notes (Addendum)
Subjective:     Patient ID: Christy Porter, female   DOB: 10-03-1970, 40 y.o.   MRN: [id number]  HPI this 40 year old healthy female has a long history of enlarging varicose veins in the left leg. These began 10 years ago and gradually enlarged. She just completed her second pregnancy with significant enlargement during that time period. She has been wearing a long-leg last to compression stockings (20-30 mm) with no improvement of her symptoms. He has worn these for 6 months and has also tried elevation and ibuprofen with no improvement she describes a burning throbbing heavy discomfort which worsens as the day progresses she states her left foot feels as though it is "dead" she has no history of DVT thrombophlebitis stasis ulcers bleeding and has mild swelling in the left ankle as the day progresses.   Review of Systems review of systems includes occasional headaches, leg discomfort with walking, but negative for chest pain dyspnea on exertion PND orthopnea or any other symptoms on complete review of systems.    Objective:   Physical Exam blood pressure 119/84 heart rate 72 respirations 20 General she is well-developed well-nourished female no apparent distress HEENT exam is normal for age Chest clear to auscultation no rhonchi or wheezing Cardiovascular reveals regular rhythm no murmurs carotid pulses 3+ no bruits Abdomen soft nontender with no masses Chest 3+ femoral and dorsalis pedis pulses palpable bilaterally Neurologic exam is normal Skin exam revealed bulging varicosities in the left distal thigh and medial calf extending across the pretibial region into the lateral ankle area. No stasis ulcers are noted. She has reticular and early spider veins in the left ankle area medially and laterally. There is 1+ edema on left. Right leg is unremarkable.    Assessment:     Today I performed an independent sono site ultrasound exam the left leg which reveals gross reflux in the left great  saphenous vein from the saphenofemoral junction to the knee. As patient has bulging varicosities do too great saphenous reflux from valvular incompetence. It is affecting her daily living and has been treated unsuccessfully with long-leg elastic compression stockings, elevation, and ibuprofen.   her job requires her to be on her feet frequently which is aggravating her symptomatology. Plan:     #1 we'll get formal reflux duplex examination of left leg to confirm above findings #2 when this has been performed we will then proceed with pre-certification for the patient to perform laser ablation of the left great saphenous vein with multiple stab phlebectomy (greater than 20)

## 2010-11-25 NOTE — Progress Notes (Signed)
Addended by: Clementeen Hoof on: 11/25/2010 10:05 AM   Modules accepted: Orders

## 2010-11-28 ENCOUNTER — Other Ambulatory Visit: Payer: Self-pay

## 2010-11-28 DIAGNOSIS — I83893 Varicose veins of bilateral lower extremities with other complications: Secondary | ICD-10-CM

## 2010-12-01 ENCOUNTER — Ambulatory Visit (INDEPENDENT_AMBULATORY_CARE_PROVIDER_SITE_OTHER): Payer: BC Managed Care – PPO | Admitting: *Deleted

## 2010-12-01 DIAGNOSIS — I83893 Varicose veins of bilateral lower extremities with other complications: Secondary | ICD-10-CM

## 2010-12-02 NOTE — Progress Notes (Signed)
Addended by: Clementeen Hoof on: 12/02/2010 12:08 PM   Modules accepted: Orders

## 2010-12-09 NOTE — Procedures (Unsigned)
LOWER EXTREMITY VENOUS REFLUX EXAM  INDICATION:  Left lower extremity varicose veins.  EXAM:  Using color-flow imaging and pulse Doppler spectral analysis, the left common femoral, superficial femoral, popliteal, posterior tibial, greater and lesser saphenous veins are evaluated.  There is evidence suggesting deep venous insufficiency in the left lower extremity.  The left saphenofemoral junction is not competent with Reflux of >545milliseconds. The left GSV is not competent with Reflux of >535milliseconds with the caliber as described below.  The left proximal short saphenous vein demonstrates competency.  GSV Diameter (used if found to be incompetent only)                                           Right    Left Proximal Greater Saphenous Vein           cm       0.78 cm Proximal-to-mid-thigh                     cm       0.71 cm Mid thigh                                 cm       0.58 cm Mid-distal thigh                          cm       cm Distal thigh                              cm       0.68 cm 1.47 cm Knee                                      cm       cm  IMPRESSION: 1. The left greater saphenous vein is not competent with reflux     >525milliseconds. 2. The left greater saphenous vein is not tortuous. 3. The left deep venous system is not competent with Reflux of     >537milliseconds. 4. The left lesser saphenous vein is competent.  ___________________________________________ Quita Skye. Hart Rochester, M.D.  EM/MEDQ  D:  12/01/2010  T:  12/01/2010  Job:  [id number]

## 2011-01-02 LAB — COMPREHENSIVE METABOLIC PANEL
ALT: 13
ALT: 237 — ABNORMAL HIGH
ALT: 439 — ABNORMAL HIGH
AST: 124 — ABNORMAL HIGH
AST: 21
AST: 669 — ABNORMAL HIGH
Albumin: 2.2 — ABNORMAL LOW
Alkaline Phosphatase: 64
Alkaline Phosphatase: 66
BUN: 4 — ABNORMAL LOW
BUN: 5 — ABNORMAL LOW
CO2: 23
CO2: 24
CO2: 25
CO2: 26
Calcium: 7.1 — ABNORMAL LOW
Calcium: 7.8 — ABNORMAL LOW
Calcium: 8.1 — ABNORMAL LOW
Calcium: 8.1 — ABNORMAL LOW
Calcium: 8.8
Chloride: 104
Chloride: 105
Chloride: 107
Creatinine, Ser: 0.45
Creatinine, Ser: 0.57
Creatinine, Ser: 0.6
GFR calc Af Amer: >60
GFR calc Af Amer: >60
GFR calc Af Amer: >60
GFR calc Af Amer: >60
GFR calc non Af Amer: >60
GFR calc non Af Amer: >60
GFR calc non Af Amer: >60
GFR calc non Af Amer: >60
Glucose, Bld: 86
Glucose, Bld: 88
Glucose, Bld: 92
Potassium: 3.1 — ABNORMAL LOW
Potassium: 3.5
Sodium: 129 — ABNORMAL LOW
Sodium: 134 — ABNORMAL LOW
Sodium: 135
Sodium: 136
Total Bilirubin: 0.8
Total Bilirubin: 0.9
Total Protein: 5.2 — ABNORMAL LOW

## 2011-01-02 LAB — CBC
HCT: 32.7 — ABNORMAL LOW
HCT: 33.5 — ABNORMAL LOW
HCT: 36.1
Hemoglobin: 11.2 — ABNORMAL LOW
Hemoglobin: 11.5 — ABNORMAL LOW
Hemoglobin: 11.8 — ABNORMAL LOW
Hemoglobin: 12.8
MCHC: 35.1
MCHC: 35.1
MCHC: 35.6
MCHC: 35.6
MCHC: 35.7
MCV: 97.6
MCV: 97.6
MCV: 98
MCV: 98.1
MCV: 99
Platelets: 110 — ABNORMAL LOW
RBC: 3.21 — ABNORMAL LOW
RBC: 3.24 — ABNORMAL LOW
RBC: 3.38 — ABNORMAL LOW
RBC: 3.39 — ABNORMAL LOW
RBC: 3.55 — ABNORMAL LOW
RBC: 3.74 — ABNORMAL LOW
RDW: 12.9
RDW: 13
RDW: 13.4
WBC: 10.7 — ABNORMAL HIGH
WBC: 10.8 — ABNORMAL HIGH
WBC: 11.7 — ABNORMAL HIGH
WBC: 12.9 — ABNORMAL HIGH

## 2011-01-02 LAB — LACTATE DEHYDROGENASE
LDH: 184
LDH: 388 — ABNORMAL HIGH
LDH: 93 — ABNORMAL LOW

## 2011-01-02 LAB — URIC ACID: Uric Acid, Serum: 4.5

## 2011-01-02 LAB — HIV ANTIBODY (ROUTINE TESTING W REFLEX): HIV: NONREACTIVE

## 2011-01-02 LAB — PROTEIN, URINE, 24 HOUR
Collection Interval-UPROT: 24
Protein, Urine: 2

## 2011-01-02 LAB — SAMPLE TO BLOOD BANK

## 2011-01-02 LAB — MAGNESIUM
Magnesium: 1.8
Magnesium: 4.4 — ABNORMAL HIGH

## 2011-01-02 LAB — CREATININE CLEARANCE, URINE, 24 HOUR: Urine Total Volume-CRCL: 7950

## 2011-01-05 LAB — STREP B DNA PROBE: Strep Group B Ag: NEGATIVE

## 2011-01-06 ENCOUNTER — Encounter (HOSPITAL_COMMUNITY): Payer: Self-pay | Admitting: *Deleted

## 2011-01-20 ENCOUNTER — Other Ambulatory Visit: Payer: Self-pay | Admitting: *Deleted

## 2011-01-20 DIAGNOSIS — I83893 Varicose veins of bilateral lower extremities with other complications: Secondary | ICD-10-CM

## 2011-01-23 ENCOUNTER — Encounter: Payer: Self-pay | Admitting: Vascular Surgery

## 2011-01-26 ENCOUNTER — Other Ambulatory Visit: Payer: Self-pay | Admitting: *Deleted

## 2011-01-26 ENCOUNTER — Ambulatory Visit (INDEPENDENT_AMBULATORY_CARE_PROVIDER_SITE_OTHER): Payer: BC Managed Care – PPO | Admitting: Vascular Surgery

## 2011-01-26 ENCOUNTER — Encounter: Payer: Self-pay | Admitting: Vascular Surgery

## 2011-01-26 VITALS — BP 111/79 | HR 91 | Resp 18 | Ht 68.0 in | Wt 158.2 lb

## 2011-01-26 DIAGNOSIS — I83893 Varicose veins of bilateral lower extremities with other complications: Secondary | ICD-10-CM

## 2011-01-26 NOTE — H&P (Signed)
  NAMESHRONDA, Christy Porter               ACCOUNT NO.:  [id number]  MEDICAL RECORD NO.:  [id number]  LOCATION:  SDC                           FACILITY:  [facility]  PHYSICIAN:  Dineen Kid. Rana Snare, M.D.    DATE OF BIRTH:  January 09, 1971  DATE OF ADMISSION:  10/23/2010 DATE OF DISCHARGE:                             HISTORY & PHYSICAL   HISTORY OF PRESENT ILLNESS:  Christy Porter is a 40 year old G2, P1.  She is 37-4/7 weeks' gestational age with the [facility] of November 11, 2010, who presents for repeat cesarean section.  Her pregnancy has been complicated by gestational diabetes, chronic hypertension, history of HELLP syndrome with previous pregnancy, previous cesarean section, and recently underwent an amniocentesis showing mature fetal lungs with an LS ratio of 4.71 with PG.  PAST MEDICAL HISTORY:  Significant for chronic hypertension.  PAST SURGICAL HISTORY:  Significant for breast reduction, also cesarean section for HELLP syndrome, previous pregnancy at 29 weeks.  PHYSICAL EXAM:  VITAL SIGNS:  Her blood pressure is 110/80. HEART:  Regular rate and rhythm. LUNGS:  Clear to auscultation bilaterally. ABDOMEN:  Nondistended and nontender with gravid.  Vertex presentation with estimated fetal weight is 6 pounds 10 ounces.  Amniocentesis showing 4.71 ratio.  Cervix is 150% and -2 station.  Her reflex is 1/4 bilaterally with minimal swelling.  IMPRESSION:  Intrauterine pregnancy at term, 37-1/2 weeks' gestational age.  Due to chronic hypertension, gestational diabetes which has been diet controlled and the patient's anxiety, the patient elected the amniocentesis which was mature with fetal lung maturity.  She desires to proceed with repeat cesarean section, also with multiparity desires sterility.  PLAN:  Repeat low-segment transverse cesarean section and bilateral tubal ligation.  Discussed the risks and benefits of procedure at length which include but not limited to risk of infection, bleeding, damage to the  bowel, bladder, uterus, fetus, tubal failure quoted 07/998.  She gives her informed consent and wished to proceed.     Dineen Kid Rana Snare, M.D.     DCL/MEDQ  D:  10/23/2010  T:  10/23/2010  Job:  [id number]  Electronically Signed by Candice Camp M.D. on 01/26/2011 06:07:49 PM

## 2011-01-26 NOTE — Progress Notes (Signed)
Subjective:     Patient ID: Christy Porter, female   DOB: May 09, 1970, 40 y.o.   MRN: [id number]  HPI this 40 year old female had extensive bulging varicosities in the left thigh and calf treated with laser ablation left great saphenous vein using 2025 J plus greater than 20 stab phlebectomy of secondary varicosities in the thigh calf proximally and distally. She tolerated this procedure well. This was done under local tumescent anesthesia. A long leg compression stocking dressing was applied at the termination of the procedure   Review of Systems     Objective:   Physical ExamBP 111/79  Pulse 91  Resp 18  Ht 5\' 8"  (1.727 m)  Wt 158 lb 3.2 oz (71.759 kg)  BMI 24.05 kg/m2     Assessment:     Well-tolerated laser ablation left great saphenous vein with greater than 20 stab phlebectomy performed under local tumescent anesthesia.    Plan:     Return in one week with venous duplex exam to confirm closure left greater saphenous vein.

## 2011-01-26 NOTE — Progress Notes (Signed)
Laser Ablation Procedure      Date: 01/26/2011    Christy Porter DOB:Jul 25, 1970  Consent signed: Yes  Surgeon:J.D. Hart [place]  Procedure: Laser Ablation: left Greater Saphenous Vein  BP 111/79  Pulse 91  Resp 18  Ht 5\' 8"  (1.727 m)  Wt 158 lb 3.2 oz (71.759 kg)  BMI 24.05 kg/m2 s  Start time: 11:20 AM   End time: 1:00PM  Tumescent Anesthesia: 475 cc 0.9% NaCl with 50 cc Lidocaine HCL with 1% Epi and 15 cc 8.4% NaHCO3  Local Anesthesia: 13 cc Lidocaine HCL and NaHCO3 (ratio 2:1)  Pulsed Mode 15 Watts 1 second  1 Pulse  Total Pulses  136   Total Energy  2025 Joules  Total Time 2 :15     Stab Phlebectomy: 20 Sites: Thigh, Calf and Ankle  Patient tolerated procedure well: Yes  Rankin, Neena Rhymes  Description of Procedure:  After marking the course of the saphenous vein and the secondary varicosities in the standing position, the patient was placed on the operating table in the supine position, and the left leg was prepped and draped in sterile fashion. Local anesthetic was administered, and under ultrasound guidance the saphenous vein was accessed with a micro needle and guide wire; then the micro puncture sheath was placed. A guide wire was inserted to the saphenofemoral junction, followed by a 5 french sheath.  The position of the sheath and then the laser fiber below the junction was confirmed using the ultrasound and visualization of the aiming beam.  Tumescent anesthesia was administered along the course of the saphenous vein using ultrasound guidance. Protective laser glasses were placed on the patient, and the laser was fired at 15 watt pulsed mode advancing 1-2 mm per sec.  For a total of 2025 joules.  A steri strip was applied to the puncture site.  The patient was then put into Trendelenburg position.  Local anesthetic was utilized overlying the marked varicosities.  Greater than  >20stab wounds were made using the tip of an 11 blade; and using the vein hook,  The  phlebectomies were performed using a hemostat to avulse these varicosities.  Adequate hemostasis was achieved, and steri strips were applied to the stab wound.      ABD pads and thigh high compression stockings were applied.  Ace wrap bandages were applied over the phlebectomy sites and at the top of the saphenofemoral junction.  Blood loss was less than 15 cc.  The patient ambulated out of the operating room having tolerated the procedure well.

## 2011-01-27 ENCOUNTER — Telehealth: Payer: Self-pay | Admitting: *Deleted

## 2011-01-27 NOTE — Telephone Encounter (Signed)
01/27/2011  Time: 9:08 AM   Patient Name: Christy Porter  Patient WU:[id number]  Procedure:36478,[id number] L GSV  Comments/Actions Taken:LM on her ans machine.Went over instructions again. Told her to call if she needed anything.

## 2011-02-02 ENCOUNTER — Encounter: Payer: Self-pay | Admitting: Vascular Surgery

## 2011-02-03 ENCOUNTER — Encounter: Payer: Self-pay | Admitting: Vascular Surgery

## 2011-02-03 ENCOUNTER — Other Ambulatory Visit (INDEPENDENT_AMBULATORY_CARE_PROVIDER_SITE_OTHER): Payer: BC Managed Care – PPO | Admitting: *Deleted

## 2011-02-03 ENCOUNTER — Ambulatory Visit (INDEPENDENT_AMBULATORY_CARE_PROVIDER_SITE_OTHER): Payer: BC Managed Care – PPO | Admitting: Vascular Surgery

## 2011-02-03 VITALS — BP 100/64 | HR 71 | Resp 18 | Ht 68.0 in | Wt 157.0 lb

## 2011-02-03 DIAGNOSIS — I83893 Varicose veins of bilateral lower extremities with other complications: Secondary | ICD-10-CM

## 2011-02-03 NOTE — Progress Notes (Signed)
Subjective:     Patient ID: Christy Porter, female   DOB: 1971/02/04, 40 y.o.   MRN: [id number]  HPI this 40 year old healthy female returns 1 week post laser ablation of her left great saphenous vein and multiple stab phlebectomy is for painful varicosities do to valvular reflux in the left great saphenous system she tolerated the procedure well last week and has had minimal discomfort along the course of the great saphenous vein in the groin and thigh area. He has been wearing her long leg elastic compression stocking (20-30 mm gradient) and elevating her legs when not ambulating.  Review of Systems     Objective:   Physical ExamBP 100/64  Pulse 71  Resp 18  Ht 5\' 8"  (1.727 m)  Wt 157 lb (71.215 kg)  BMI 23.87 kg/m2 The left leg has some mild to moderate ecchymosis in the mid thigh. There is no distal edema in the left leg. She has 3+ pulses throughout including the posterior tib and dorsalis pedis the left foot. There is mild tenderness along the course of the great saphenous vein.  Today I ordered a venous duplex exam of the left leg which are reviewed and interpreted. She has no DVT. The left great saphenous vein is totally occluded from the distal thigh to the saphenofemoral junction.    Assessment:    doing well post laser ablation left great saphenous vein with multiple stab phlebectomy    Plan:     Will wear elastic compression stocking for one more week. Return to see me in a when necessary basis

## 2011-02-09 ENCOUNTER — Other Ambulatory Visit: Payer: BC Managed Care – PPO

## 2011-02-09 ENCOUNTER — Encounter: Payer: BC Managed Care – PPO | Admitting: Vascular Surgery

## 2011-02-17 ENCOUNTER — Ambulatory Visit (INDEPENDENT_AMBULATORY_CARE_PROVIDER_SITE_OTHER): Payer: BC Managed Care – PPO | Admitting: *Deleted

## 2011-02-17 DIAGNOSIS — I83893 Varicose veins of bilateral lower extremities with other complications: Secondary | ICD-10-CM

## 2011-02-17 DIAGNOSIS — I781 Nevus, non-neoplastic: Secondary | ICD-10-CM

## 2011-02-17 NOTE — Procedures (Unsigned)
DUPLEX DEEP VENOUS EXAM - LOWER EXTREMITY  INDICATION:  Left greater saphenous vein laser ablation.  HISTORY:  Edema:  No. Trauma/Surgery:  Left greater saphenous vein laser ablation on 02/05/2011. Pain:  Left medial knee tenderness. PE:  No. Previous DVT:  No. Anticoagulants: Other:  DUPLEX EXAM:               CFV   SFV   PopV  PTV    GSV               R  L  R  L  R  L  R   L  R  L Thrombosis    o  o     o     o      o     + Spontaneous   +  +     +     +      +     o Phasic        +  +     +     +      +     o Augmentation  +  +     +     +      +     o Compressible  +  +     +     +      +     o Competent     +  o     +     o      +     o  Legend:  + - yes  o - no  p - partial  D - decreased  IMPRESSION: 1. No evidence of deep venous thrombosis noted in the left lower     extremity. 2. The left greater saphenous vein appears totally occluded from the     distal insertion site to the saphenofemoral junction. 3. Reflux of >500 milliseconds noted in the left common femoral vein     and popliteal veins.    _____________________________ Quita Skye Hart Rochester, M.D.  CH/MEDQ  D:  02/04/2011  T:  02/04/2011  Job:  [id number]

## 2011-02-17 NOTE — Progress Notes (Signed)
Patient came in for evaluation of some spider and reticular veins for potential sclerotherapy. She is an excellent candidate for injections and it will probably take 2 syringes. We agreed upon an appt. Time in Jan. Will see her then.

## 2011-03-02 ENCOUNTER — Encounter: Payer: Self-pay | Admitting: Vascular Surgery

## 2011-04-22 ENCOUNTER — Ambulatory Visit (INDEPENDENT_AMBULATORY_CARE_PROVIDER_SITE_OTHER): Payer: BC Managed Care – PPO | Admitting: *Deleted

## 2011-04-22 ENCOUNTER — Encounter: Payer: Self-pay | Admitting: Vascular Surgery

## 2011-04-22 DIAGNOSIS — I868 Varicose veins of other specified sites: Secondary | ICD-10-CM

## 2011-04-22 DIAGNOSIS — I781 Nevus, non-neoplastic: Secondary | ICD-10-CM

## 2011-04-22 NOTE — Progress Notes (Signed)
X=.5% Sotradecol administered with a 27g butterfly.  Patient received a total of 12cc.  Treated reticulars and small spiders for this nice post laser patient. She'll need more work on the right leg which we didn't treat today. Anticipate good results. Will follow prn.  Photos: yes  Compression stockings applied: yes and an ace wrap.

## 2011-04-24 ENCOUNTER — Ambulatory Visit (HOSPITAL_COMMUNITY)
Admission: RE | Admit: 2011-04-24 | Discharge: 2011-04-24 | Disposition: A | Discharge status: Home / Self Care | Payer: BC Managed Care – PPO | Source: Ambulatory Visit | Attending: Obstetrics and Gynecology | Admitting: Obstetrics and Gynecology

## 2011-04-24 ENCOUNTER — Other Ambulatory Visit (HOSPITAL_COMMUNITY): Payer: Self-pay | Admitting: Obstetrics and Gynecology

## 2011-04-24 DIAGNOSIS — R1030 Lower abdominal pain, unspecified: Secondary | ICD-10-CM

## 2011-04-24 DIAGNOSIS — N949 Unspecified condition associated with female genital organs and menstrual cycle: Secondary | ICD-10-CM | POA: Insufficient documentation

## 2011-04-24 DIAGNOSIS — R102 Pelvic and perineal pain: Secondary | ICD-10-CM

## 2011-04-24 DIAGNOSIS — K5790 Diverticulosis of intestine, part unspecified, without perforation or abscess without bleeding: Secondary | ICD-10-CM

## 2011-04-24 DIAGNOSIS — K5730 Diverticulosis of large intestine without perforation or abscess without bleeding: Secondary | ICD-10-CM | POA: Insufficient documentation

## 2011-04-24 DIAGNOSIS — R109 Unspecified abdominal pain: Secondary | ICD-10-CM | POA: Insufficient documentation

## 2011-04-24 IMAGING — CT CT ABD-PELV W/ CM
1 of 3 series · 15 of 32 positions shown, 20 images · IV contrast (OMNIPAQUE)
Comparison: None.

CLINICAL DATA: Lower abdominal pain

CT ABDOMEN AND PELVIS WITH CONTRAST
TECHNIQUE: Multidetector CT imaging of the abdomen and pelvis was
performed following the standard protocol during bolus
administration of intravenous contrast.
Contrast: 100mL OMNIPAQUE IOHEXOL 300 MG/ML IV SOLN

[Series 2: routine abdomen/pelvis with · axial · 0.75mm/px · z∈[-472,-72]mm · 15 of 90 slices shown, 20 images]
[im 5/90  soft-tissue]
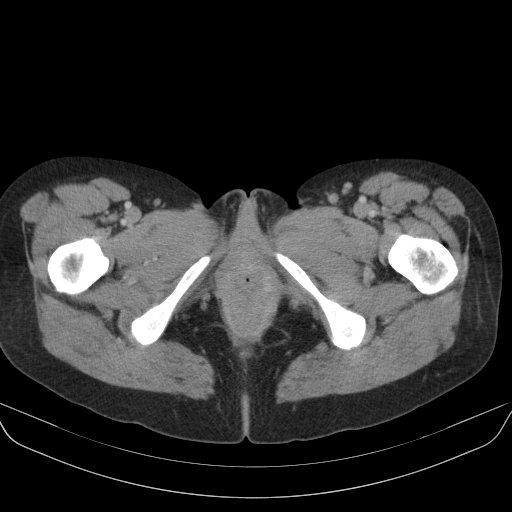
[im 5/90  bone]
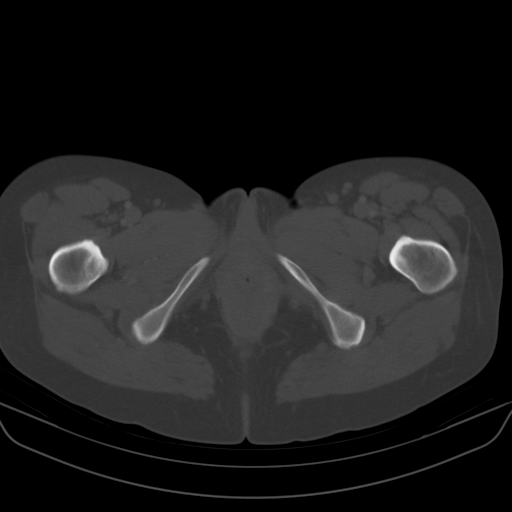
[im 14/90  soft-tissue]
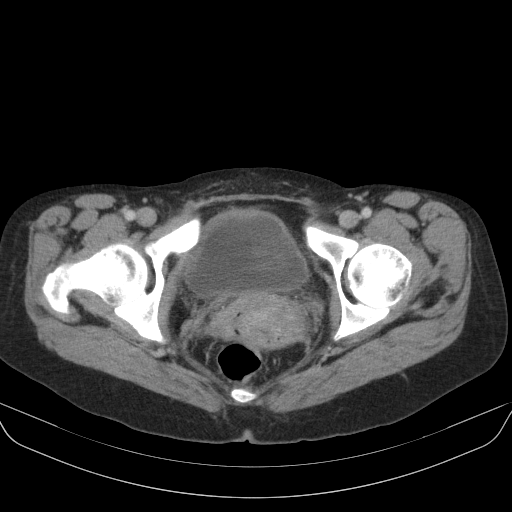
[im 18/90  soft-tissue]
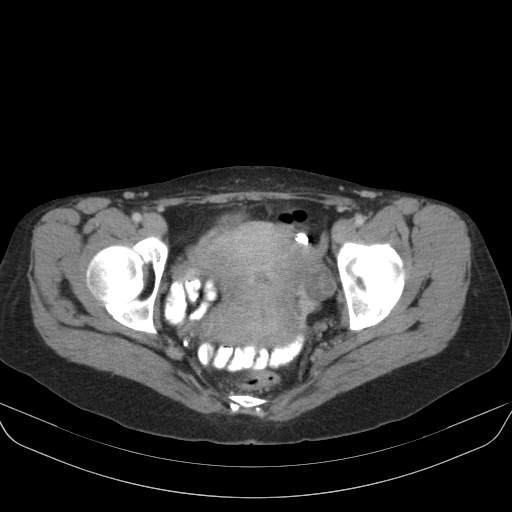
[im 23/90  soft-tissue]
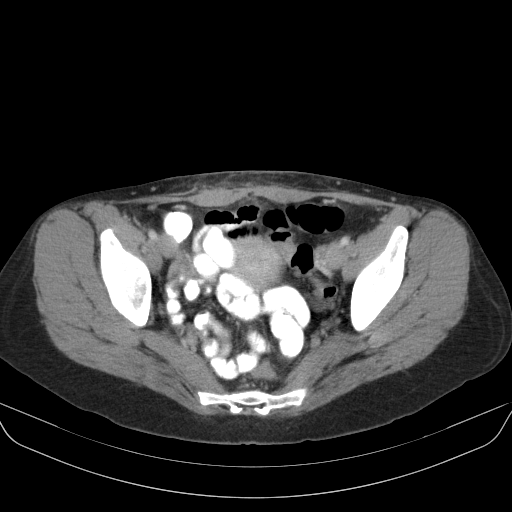
[im 32/90  soft-tissue]
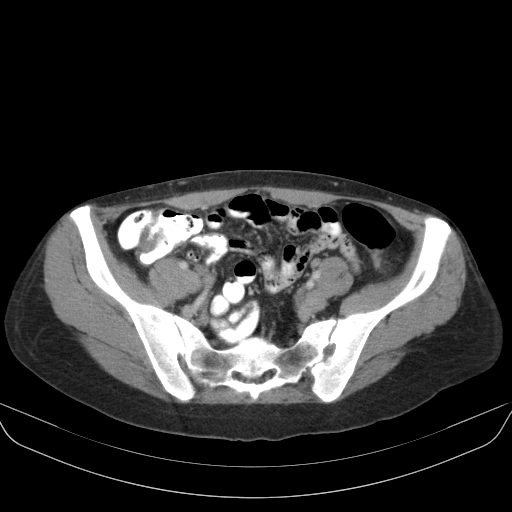
[im 36/90  soft-tissue]
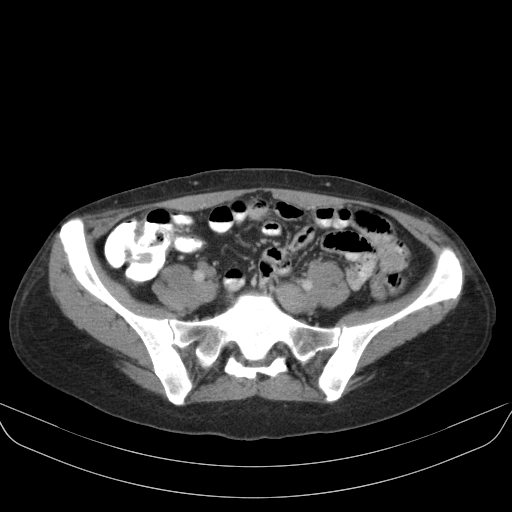
[im 41/90  soft-tissue]
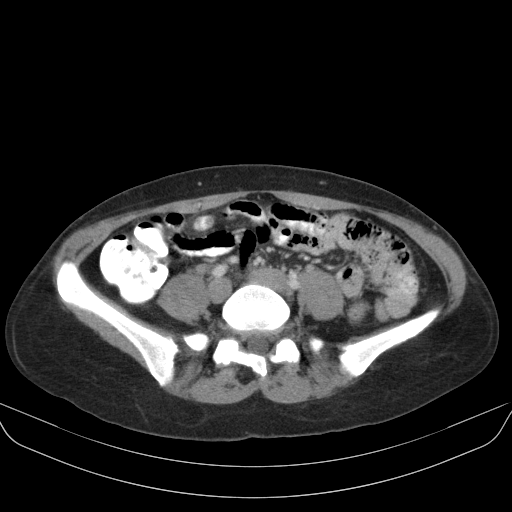
[im 49/90  soft-tissue]
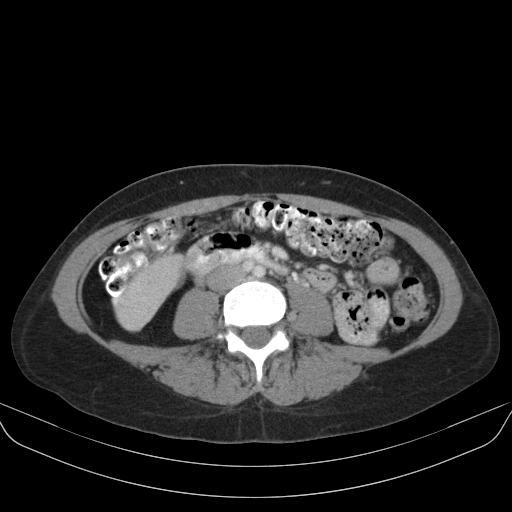
[im 54/90  soft-tissue]
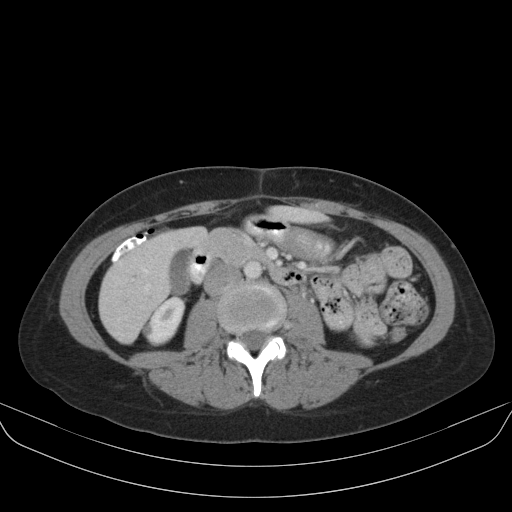
[im 54/90  bone]
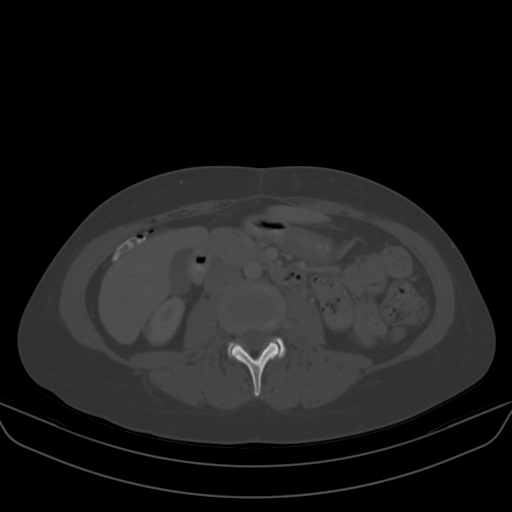
[im 58/90  soft-tissue]
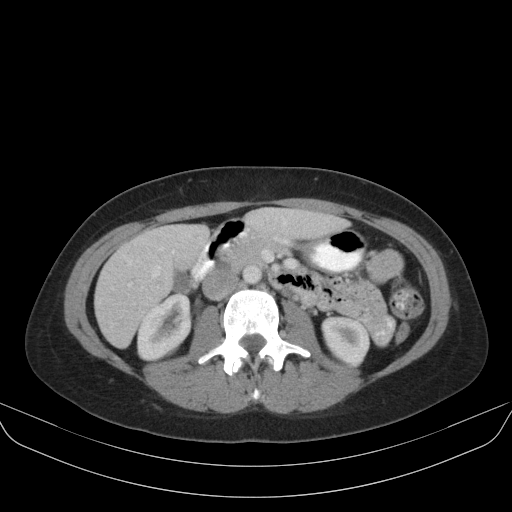
[im 67/90  soft-tissue]
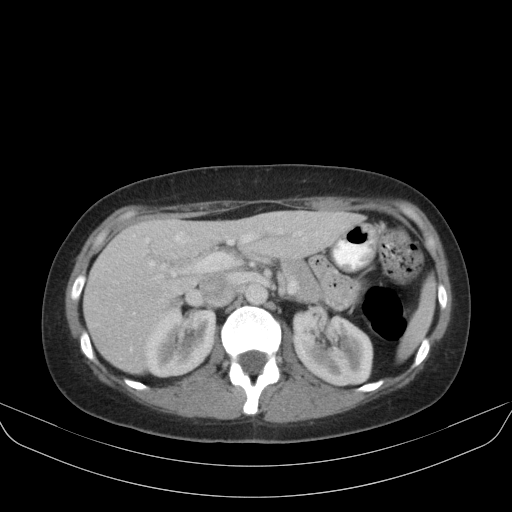
[im 72/90  soft-tissue]
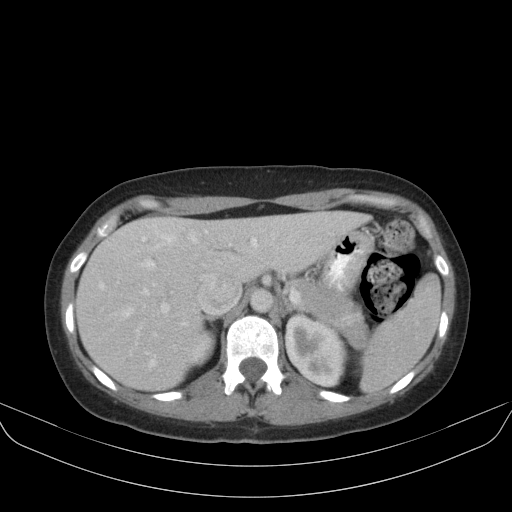
[im 72/90  lung]
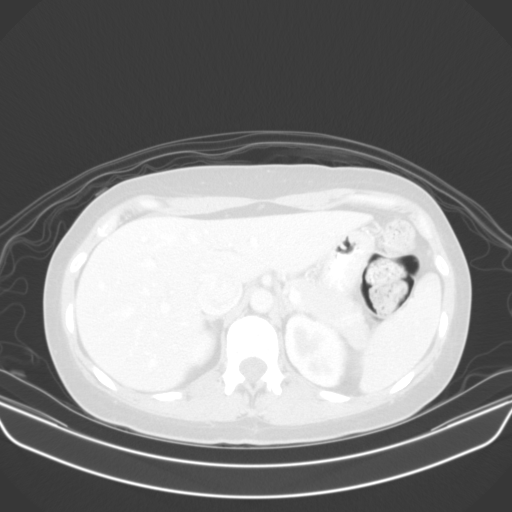
[im 76/90  soft-tissue]
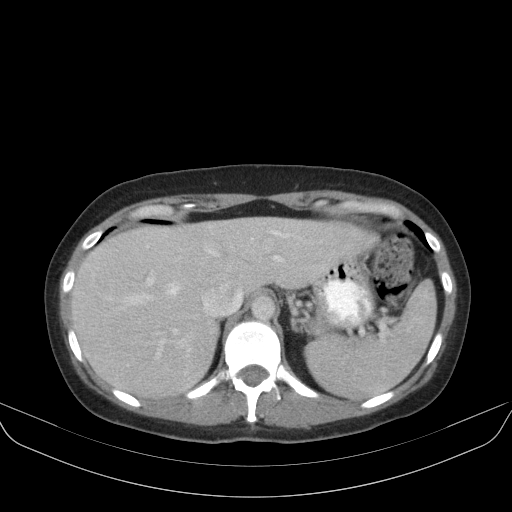
[im 76/90  lung]
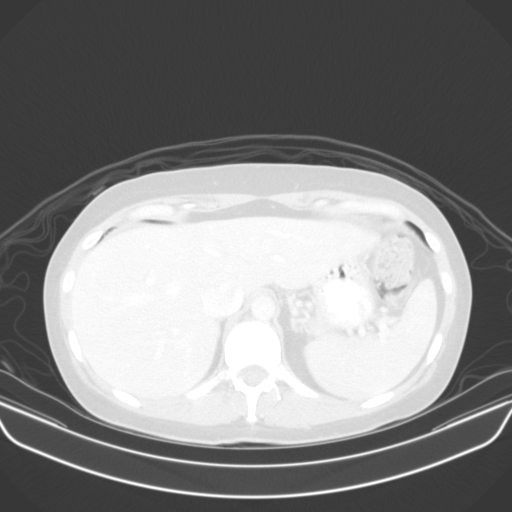
[im 81/90  lung]
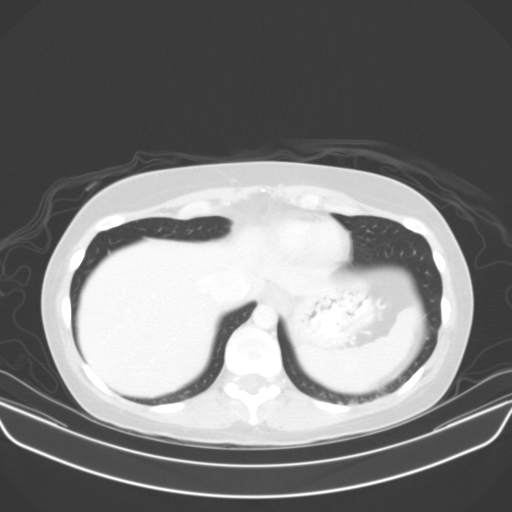
[im 85/90  soft-tissue]
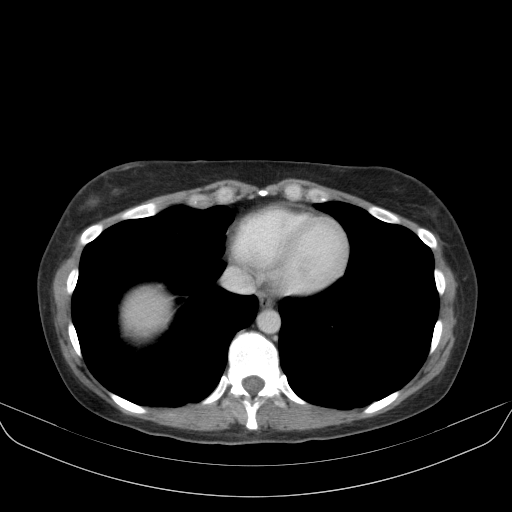
[im 85/90  lung]
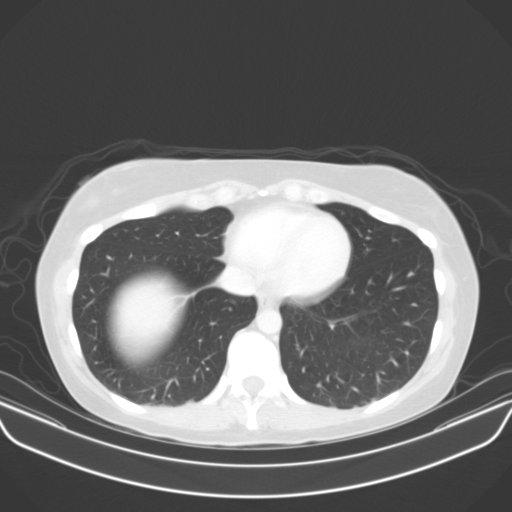

[15 of 32 positions shown; findings below may reference images not displayed]

FINDINGS: Liver, gallbladder, spleen, pancreas, adrenal glands,
kidneys are within normal limits.  Extrarenal pelvis on the right.
Prominent stool in the transverse colon.  No evidence of sigmoid
diverticulitis.  No free fluid.  No abnormal adenopathy. Uterus,
adnexa, and bladder are within normal limits.  Normal appendix.
IMPRESSION: No acute intra-abdominal pathology.

## 2011-04-24 MED ORDER — IOHEXOL 300 MG/ML  SOLN
100.0000 mL | Freq: Once | INTRAMUSCULAR | Status: AC | PRN
  Administered 2011-04-24: 100 mL via INTRAVENOUS

## 2011-07-30 ENCOUNTER — Telehealth: Payer: Self-pay | Admitting: *Deleted

## 2011-07-30 NOTE — Telephone Encounter (Addendum)
Returned [name]'s phone message from this morning.  [name] states she has a small area on her left leg that is red and tender to touch.  No problem with swelling or pain in left ankle or foot.  Recommended that [name] wear her compression hose, take Ibuprofen 600 mg three times daily with meals, use heating pain as needed prn pain to affected area, and elevate her leg when sitting.  Encouraged her to call [facility] if symptoms worsened or if she experienced pain and swelling in left foot or ankle.  [name] verbalized understanding and appears relieved.  Christy Porter, Neena Rhymes

## 2011-10-22 ENCOUNTER — Ambulatory Visit
Admission: RE | Admit: 2011-10-22 | Discharge: 2011-10-22 | Disposition: A | Discharge status: Home / Self Care | Payer: BC Managed Care – PPO | Source: Ambulatory Visit | Attending: Internal Medicine | Admitting: Internal Medicine

## 2011-10-22 ENCOUNTER — Other Ambulatory Visit: Payer: Self-pay | Admitting: Internal Medicine

## 2011-10-22 DIAGNOSIS — R209 Unspecified disturbances of skin sensation: Secondary | ICD-10-CM

## 2011-10-22 DIAGNOSIS — R51 Headache: Secondary | ICD-10-CM

## 2012-03-02 DIAGNOSIS — M79609 Pain in unspecified limb: Secondary | ICD-10-CM | POA: Insufficient documentation

## 2012-07-11 ENCOUNTER — Other Ambulatory Visit: Payer: Self-pay

## 2012-07-11 DIAGNOSIS — Z1231 Encounter for screening mammogram for malignant neoplasm of breast: Secondary | ICD-10-CM

## 2012-07-13 ENCOUNTER — Encounter: Payer: Self-pay | Admitting: Neurology

## 2012-07-13 DIAGNOSIS — M79609 Pain in unspecified limb: Secondary | ICD-10-CM

## 2012-07-14 ENCOUNTER — Ambulatory Visit (INDEPENDENT_AMBULATORY_CARE_PROVIDER_SITE_OTHER): Payer: BC Managed Care – PPO | Admitting: Neurology

## 2012-07-14 ENCOUNTER — Encounter: Payer: Self-pay | Admitting: Neurology

## 2012-07-14 VITALS — BP 116/79 | HR 70 | Wt 145.0 lb

## 2012-07-14 DIAGNOSIS — M79609 Pain in unspecified limb: Secondary | ICD-10-CM

## 2012-07-14 NOTE — Progress Notes (Signed)
Reason for visit: Leg pain  Christy Porter is an 42 y.o. female  History of present illness:  Christy Porter is a 42 year old left-handed white female with a history of discomfort involving the left thigh area just above the knee. The patient indicates that she had a vein stripping procedure on the left leg, but it did not involve the area where the discomfort is. The patient indicates that the discomfort is intermittent in nature, and she may go up to a week without any problems. The patient indicates that she has discomfort only when she is inactive, and she does not notice any problems when she is up walking, or running. The patient is able to exercise without problems, and she denies any weakness of the leg. The patient denies any numbness. The patient may have muscle twitches or "tremors" in the thigh muscle, but also in other areas of the body such as the arms. The patient recently was noted to have a vitamin D deficiency. The patient has had an evaluation through a rheumatologist, and blood work evaluation was unremarkable. MRI evaluation of the cervical spine and lumbosacral spine as been done, and this is relatively unremarkable. The patient has had nerve conduction studies and EMG evaluation that were unremarkable. The patient returns for an evaluation. In the past, the patient has had a mild anemia, with an elevated MCV.  Past Medical History  Diagnosis Date  . Diabetes mellitus     gest diabetes, diet controlled  . Hypertension   . Vitamin D deficiency   . HELLP syndrome     Past Surgical History  Procedure Laterality Date  . Cesarean section  '08  . Breast surgery      Family History  Problem Relation Age of Onset  . Hypertension Mother   . Lupus Maternal Grandmother     Social history:  reports that she has never smoked. She has never used smokeless tobacco. She reports that  drinks alcohol. She reports that she does not use illicit drugs.  Allergies: No Known  Allergies  Medications:  Current Outpatient Prescriptions on File Prior to Visit  Medication Sig Dispense Refill  . hydrochlorothiazide 25 MG tablet Take 25 mg by mouth daily.        . Prenatal Vit-Fe Fumarate-FA (PRENATAL FORMULA) 27-1 MG tablet Take 1 tablet by mouth daily.         No current facility-administered medications on file prior to visit.    ROS:  Out of a complete 14 system review of symptoms, the patient complains only of the following symptoms, and all other reviewed systems are negative.  Tremors Leg discomfort  Blood pressure 116/79, pulse 70, weight 145 lb (65.772 kg).  Physical Exam  General: The patient is alert and cooperative at the time of the examination.  Skin: No significant peripheral edema is noted.   Neurologic Exam  Cranial nerves: Facial symmetry is present. Speech is normal, no aphasia or dysarthria is noted. Extraocular movements are full. Visual fields are full.  Motor: The patient has good strength in all 4 extremities.  Coordination: The patient has good finger-nose-finger and heel-to-shin bilaterally.  Gait and station: The patient has a normal gait. Tandem gait is normal. Romberg is negative. No drift is seen.  Reflexes: Deep tendon reflexes are symmetric.   Assessment/Plan:  1. Left lower extremity discomfort  The etiology of her discomfort is not clear. The pain comes on only with rest, and goes away with activity. It is possible that  this could be a variation of a restless leg syndrome. The patient will be sent for blood work today. If the iron level or vitamin B12 level or low, this will be treated. If not, the patient may be given a trial on Requip to see if the symptoms improve. If nothing seems to help, MRI evaluation of the brain may be done in the future to exclude the possibility of demyelinating disease. Clinical examination however, is normal.  Christy Palau MD 07/14/2012 12:44 PM  [facility] [address] [place], [place] [address]  Phone [phone] Fax [phone]

## 2012-07-15 LAB — FOLATE: Folate: 15.3 ng/mL (ref >3.0)

## 2012-07-15 LAB — FERRITIN: Ferritin: 29 ng/mL (ref 15–150)

## 2012-07-21 NOTE — Progress Notes (Signed)
Quick Note:  I called and left a message for the patient that her lab results were normal. ______ 

## 2012-08-08 ENCOUNTER — Ambulatory Visit: Payer: BC Managed Care – PPO

## 2012-08-17 ENCOUNTER — Ambulatory Visit
Admission: RE | Admit: 2012-08-17 | Discharge: 2012-08-17 | Disposition: A | Discharge status: Home / Self Care | Payer: BC Managed Care – PPO | Source: Ambulatory Visit

## 2012-08-17 ENCOUNTER — Ambulatory Visit: Payer: BC Managed Care – PPO

## 2012-08-17 DIAGNOSIS — Z1231 Encounter for screening mammogram for malignant neoplasm of breast: Secondary | ICD-10-CM

## 2012-12-13 ENCOUNTER — Ambulatory Visit (INDEPENDENT_AMBULATORY_CARE_PROVIDER_SITE_OTHER): Payer: BC Managed Care – PPO | Admitting: Neurology

## 2012-12-13 ENCOUNTER — Encounter: Payer: Self-pay | Admitting: Neurology

## 2012-12-13 VITALS — BP 108/78 | HR 68 | Temp 98.1°F | Resp 16 | Wt 143.0 lb

## 2012-12-13 DIAGNOSIS — R209 Unspecified disturbances of skin sensation: Secondary | ICD-10-CM

## 2012-12-13 LAB — VITAMIN B12: Vitamin B-12: 280 pg/mL (ref 211–911)

## 2012-12-13 MED ORDER — GABAPENTIN 100 MG PO CAPS: ORAL_CAPSULE | ORAL | Status: DC

## 2012-12-13 NOTE — Patient Instructions (Addendum)
1.  Start taking neurontin 2 tablets at bedtime 2.  Check bloodwork today 3.  Return to clinic in one month

## 2012-12-13 NOTE — Progress Notes (Signed)
[facility] Neurology [facility] Clinic Note - Initial Visit   Date: 12/13/2012   Christy Porter MRN: [id number] DOB: 1970/07/02   Dear Dr Timothy Lasso:  Thank you for your kind referral of Christy Porter for consultation of left leg discomfort. Although her history is well known to you, please allow [place] to reiterate it for the purpose of our medical record. The patient was accompanied to the clinic by self.   History of Present Illness: Christy Porter is a 42 y.o. year-old left-handed Caucasian female with history of HELLP syndrome and vitamin D deficiency presenting for evaluation of left leg paresthesias.  Her symptoms started fairly abruptly ~84-months following the delivery of her second child (October 2013) when she woke up one morning and noticed that the left lateral thigh did not feel like it usually would.  She describes the discomfort as a burning sensation localized to the mid-lateral thigh to right above the knee.  There is no radiation of symptoms into the anterior, posterior, or upper lateral thigh.  Denies any bowel/bladder symptoms.  She has associated pain which is deep and achy and occasional tingling over the antero-lateral thigh.  Symptoms are better in flat shoes and worse with heels.  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  Her symptoms are not bothersome during the day or with active, but at the end of the day or when she is resting, the discomfort is worse.  She saw orthopeadic surgery initially because of concern of irritation of the IT band and was given a two week trial of steroids, but there was no relief.  In December 2014, she saw Dr. Anne Hahn at [facility] who performed EMG which was normal.  Early this year, she started noticing sporadic twitches of the left lateral thigh but mostly after exertion.  This has only occurred three times.  She also reports having a number of intermittent symptoms since  the delivery of her second child such as numbness/tingling of her hands and left side of the face, hearing and vision changes. She has been evaluated by ENT, rheumatology, and opthalmology who did not find any abnormalities.  She also saw physical therapy because of the sensation of left leg weakness and tightness and was told that her left leg appeared to have muscle atrophy.  Symptoms were interminttent and actually improved during the summer, however about three weeks ago, they have become more constant.   She wearing belts, tight pants, or belts.  She did loose weight after her pregnancy (42lb), but symptoms did not start until 66-months later.  Her pregnancy was complicated by HELLP syndrome and varicose veins.  She underwent surgery for the large varicose vein over the left medial thigh in January 2013 and for the smaller veins over the left lateral thigh had injections placed.  She does not know what was injected, but does report that her symptoms seem to be over the same area, but started several months following the procedure.   Out-side paper records, electronic medical record, and images have been reviewed where available and summarized as:  MRI lumbar spine 02/26/2012:  No cord abnormalities, no focal stenosis or herniation. 06/02/2012 labs: CRP (1.7), CK 76, ESR 4, TSH 1.770 07/14/2012 labs:  Vitamin B12 262  Past Medical History  Diagnosis Date  . Diabetes mellitus     gest diabetes, diet controlled  . Hypertension   . Vitamin D deficiency   .  HELLP syndrome     Past Surgical History  Procedure Laterality Date  . Cesarean section  '08  . Breast surgery       Medications:  Current Outpatient Prescriptions on File Prior to Visit  Medication Sig Dispense Refill  . ergocalciferol (VITAMIN D2) 50000 UNITS capsule Take 50,000 Units by mouth once a week.      . hydrochlorothiazide 25 MG tablet Take 25 mg by mouth daily.         No current facility-administered medications on file  prior to visit.    Allergies: No Known Allergies  Family History: Family History  Problem Relation Age of Onset  . Hypertension Mother     Living  . Lupus Maternal Grandmother   . Healthy Father     Living    Social History: History   Social History  . Marital Status: Married    Spouse Name: N/A    Number of Children: 2  . Years of Education: College   Occupational History  .      Pt. works for [employer] History Main Topics  . Smoking status: Never Smoker   . Smokeless tobacco: Never Used  . Alcohol Use: Yes     Comment: Drinks alcohol on occasion  . Drug Use: No  . Sexual Activity: Not on file   Other Topics Concern  . Not on file   Social History Narrative   Works as a Lobbyist).  Lives with husband and two children.    Review of Systems:  CONSTITUTIONAL: No fevers, chills, night sweats, or weight loss.   EYES: No visual changes or eye pain ENT: No hearing changes.  No history of nose bleeds.   RESPIRATORY: No cough, wheezing and shortness of breath.   CARDIOVASCULAR: Negative for chest pain, and palpitations.   GI: Negative for abdominal discomfort, blood in stools or black stools.  No recent change in bowel habits.   GU:  No history of incontinence.   MUSCLOSKELETAL: No history of joint pain or swelling.  No myalgias.   SKIN: Negative for lesions, rash, and itching.   HEMATOLOGY/ONCOLOGY: Negative for prolonged bleeding, bruising easily, and swollen nodes.  No history of cancer.   ENDOCRINE: Negative for cold or heat intolerance, polydipsia or goiter.   PSYCH:  No depression or anxiety symptoms.   NEURO: As Above.   Vital Signs:  BP 108/78  Pulse 68  Temp(Src) 98.1 F (36.7 C)  Resp 16  Wt 143 lb (64.864 kg)  BMI 21.42 kg/m2  Neurological Exam: MENTAL STATUS including orientation to time, place, person, recent and remote memory, attention span and concentration, language, and fund of knowledge is normal.  Speech is not  dysarthric.  CRANIAL NERVES: II:  No visual field defects.  Unremarkable fundi.   III-IV-VI: Pupils equal round and reactive to light.  Normal conjugate, extra-ocular eye movements in all directions of gaze.  No nystagmus.  No ptosis.   V:  Normal facial sensation.   VII:  Normal facial symmetry and movements.   VIII:  Normal hearing and vestibular function.   IX-X:  Normal palatal movement.   XI:  Normal shoulder shrug and head rotation.   XII:  Normal tongue strength and range of motion, no deviation or fasciculation.  MOTOR:  Muscle bulk is preserved throughout (circumfential measurement of proximal and distal thigh are symmetric bilaterally - 19 and 15 inches, respectively).  No atrophy, fasciculations or abnormal movements.  No pronator drift.  Tone is normal.    Right Upper Extremity:    Left Upper Extremity:    Deltoid  5/5   Deltoid  5/5   Biceps  5/5   Biceps  5/5   Triceps  5/5   Triceps  5/5   Wrist extensors  5/5   Wrist extensors  5/5   Wrist flexors  5/5   Wrist flexors  5/5   Finger extensors  5/5   Finger extensors  5/5   Finger flexors  5/5   Finger flexors  5/5   Dorsal interossei  5/5   Dorsal interossei  5/5   Abductor pollicis  5/5   Abductor pollicis  5/5   Tone (Ashworth scale)  0  Tone (Ashworth scale)  0   Right Lower Extremity:    Left Lower Extremity:    Hip flexors  5/5   Hip flexors  5/5   Hip extensors  5/5   Hip extensors  5/5   Adductors 5/5  Adductors  5/5  Abdcutors 5/5  Abductors 5/5  Knee flexors  5/5   Knee flexors  5/5   Knee extensors  5/5   Knee extensors  5/5   Dorsiflexors  5/5   Dorsiflexors  5/5   Plantarflexors  5/5   Plantarflexors  5/5   Toe extensors  5/5   Toe extensors  5/5   Toe flexors  5/5   Toe flexors  5/5   Tone (Ashworth scale)  0  Tone (Ashworth scale)  0   MSRs:  Right                                                                 Left brachioradialis 3+  brachioradialis 3+  biceps 3+  biceps 3+  triceps 3+   triceps 3+  patellar 3+  patellar 3+  ankle jerk 2+  ankle jerk 2+  Hoffman no  Hoffman no  plantar response down  plantar response down  No crossed adductors.  1-2 beat ankle clonus.  SENSORY:  Reduced pin prick over the left lateral distal thigh (50%) which does not involve the posterior thigh or upper lateral thigh.  Otherwise, normal and symmetric perception of light touch, vibration, and proprioception.  Romberg's sign absent.   COORDINATION/GAIT: Normal finger-to- nose-finger and heel-to-shin.  Intact rapid alternating movements bilaterally.  Able to rise from a chair without using arms.  Gait narrow based and stable. Tandem and stressed gait intact.     IMPRESSION: Mrs. Grassi is a 42 year-old female presents for evaluate of 1-year history of left thigh paresthesias.  Symptoms have recently become more constant, but are not limiting her activities.  She has seen a number of providers for these symptoms and had extensive work-up including EMG, CT brain, MRI lumbar spine, MRI cervical spine, and basic labs which has been unrevealing.  Her neurological examination reveal a very localized area of diminished pin prick over the distal left lateral thigh, proximal to the knee.  This area corresponds to the distribution of the lateral femoral cutaneous nerve, however it is unusual that her symptoms do not involve the proximal anterolateral thigh, as the most common area of entrapment is at the inguinal ligament.  The only plausible explanation that I can find is  if the local injections for her varicose veins caused distal nerve irritation to the LCFN, but this is a stretch because symptom onset was several months following this procedure.  I am going to give a trial of neurontin to see if there is a change in her symptoms.  If symptoms remain persistent, may consider imaging her brain and/or thigh.   PLAN/RECOMMENDATIONS:  1.  Check B12, MMA, ANA 2.  Start neurontin 200mg  at bedtime and titrate as  needed 3.  Returnt to clinic in 3-month   The duration of this appointment visit was 80 minutes of face-to-face time with the patient.  Greater than 50% of this time was spent in counseling, explanation of diagnosis, planning of further management, and coordination of care.   Thank you for allowing me to participate in patient's care.  If I can answer any additional questions, I would be pleased to do so.    Sincerely,    Donika K. Allena Katz, DO

## 2012-12-14 LAB — ANA: Anti Nuclear Antibody(ANA): NEGATIVE

## 2012-12-15 LAB — METHYLMALONIC ACID, SERUM: Methylmalonic Acid, Quant: 0.09 umol/L (ref <0.40)

## 2012-12-19 ENCOUNTER — Telehealth: Payer: Self-pay | Admitting: Neurology

## 2012-12-19 NOTE — Telephone Encounter (Signed)
Called patient with results of labs which were normal.  She continues to have paresthesias along the left side of her thigh.  She is taking neurontin 200mg  and is planning to increase to 300mg  qhs.  She is scheduled to see me in follow-up on 10/23.  All questions were answered.  Donika K. Allena Katz, DO

## 2013-01-12 ENCOUNTER — Ambulatory Visit (INDEPENDENT_AMBULATORY_CARE_PROVIDER_SITE_OTHER): Payer: BC Managed Care – PPO | Admitting: Neurology

## 2013-01-12 ENCOUNTER — Encounter: Payer: Self-pay | Admitting: Neurology

## 2013-01-12 VITALS — BP 120/74 | HR 64 | Temp 98.0°F | Resp 12 | Ht 69.0 in | Wt 143.6 lb

## 2013-01-12 DIAGNOSIS — G5710 Meralgia paresthetica, unspecified lower limb: Secondary | ICD-10-CM

## 2013-01-12 DIAGNOSIS — G5712 Meralgia paresthetica, left lower limb: Secondary | ICD-10-CM

## 2013-01-12 NOTE — Progress Notes (Signed)
[facility] Neurology [facility]  Follow-up Visit   Date: 01/12/2013    Christy Porter MRN: [id number] DOB: 1970-11-13   Interim History: Christy Porter is a 42 y.o. year-old left-handed Caucasian female with history of HELLP syndrome and vitamin D deficiency returning to the clinic for follow-up of left leg paresthesias.  She was last seen in the office on 12/13/2012. The patient was accompanied to the clinic by self.   She is a delightful young lady and reports that her left leg discomfort has improved by 75% and it is no longer constant.  She continues to have intermittent episodes of achy pain right above her left knee.  She has taken neurontin 200mg  at bedtime which seemed to have helped, but reports it makes her very sleepy the following morning.  Otherwise, she is doing great and planning on working-out and running more again, since symptoms were much better during the summer when she was active.  History of present illness: Her symptoms started fairly abruptly ~62-months following the delivery of her second child (October 2013) when she woke up one morning and noticed that the left lateral thigh did not feel like it usually would. She describes the discomfort as a burning sensation localized to the mid-lateral thigh to right above the knee. There is no radiation of symptoms into the anterior, posterior, or upper lateral thigh. Denies any bowel/bladder symptoms. She has associated pain which is deep and achy and occasional tingling over the antero-lateral thigh. Symptoms are better in flat shoes and worse with heels. 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 Her symptoms are not bothersome during the day or with active, but at the end of the day or when she is resting, the discomfort is worse. She saw orthopeadic surgery initially because of concern of irritation of the IT band and was given a two week trial  of steroids, but there was no relief. In December 2014, she saw Dr. Anne Hahn at [facility] who performed EMG which was normal.   She also reports having a number of intermittent symptoms since the delivery of her second child such as numbness/tingling of her hands and left side of the face, hearing and vision changes. She has been evaluated by ENT, rheumatology, and opthalmology who did not find any abnormalities. She also saw physical therapy because of the sensation of left leg weakness and tightness and was told that her left leg appeared to have muscle atrophy.   She wearing belts, tight pants, or belts. She did loose weight after her pregnancy (42lb), but symptoms did not start until 46-months later. Her pregnancy was complicated by HELLP syndrome and varicose veins. She underwent surgery for the large varicose vein over the left medial thigh in January 2013 and for the smaller veins over the left lateral thigh had injections placed. She does not know what was injected, but does report that her symptoms seem to be over the same area, but started several months following the procedure.    Medications:  Current Outpatient Prescriptions on File Prior to Visit  Medication Sig Dispense Refill  . ergocalciferol (VITAMIN D2) 50000 UNITS capsule Take 50,000 Units by mouth once a week.      . gabapentin (NEURONTIN) 100 MG capsule Take two tablets at bedtime  60 capsule  2  . hydrochlorothiazide 25 MG tablet Take 25 mg by mouth daily.        . potassium  chloride SA (K-DUR,KLOR-CON) 20 MEQ tablet Take 20 mEq by mouth daily.       No current facility-administered medications on file prior to visit.    Allergies: No Known Allergies   Review of Systems:  CONSTITUTIONAL: No fevers, chills, night sweats, or weight loss.   EYES: No visual changes or eye pain ENT: No hearing changes.  No history of nose bleeds.   RESPIRATORY: No cough, wheezing and shortness of breath.   CARDIOVASCULAR: Negative for chest pain,  and palpitations.   GI: Negative for abdominal discomfort, blood in stools or black stools.  No recent change in bowel habits.   GU:  No history of incontinence.   MUSCLOSKELETAL: No history of joint pain or swelling.  No myalgias.   SKIN: Negative for lesions, rash, and itching.   HEMATOLOGY/ONCOLOGY: Negative for prolonged bleeding, bruising easily, and swollen nodes.  ENDOCRINE: Negative for cold or heat intolerance, polydipsia or goiter.   PSYCH:  No depression or anxiety symptoms.   NEURO: As Above.   Vital Signs:  BP 120/74  Pulse 64  Temp(Src) 98 F (36.7 C)  Resp 12  Ht 5\' 9"  (1.753 m)  Wt 143 lb 9.6 oz (65.137 kg)  BMI 21.2 kg/m2 Pain Scale: 0 on a scale of 0-10    Neurological Exam: MENTAL STATUS including orientation to time, place, person, recent and remote memory, attention span and concentration, language, and fund of knowledge is normal.  Speech is not dysarthric.  CRANIAL NERVES: II:  No visual field defects.   III-IV-VI: Pupils equal round and reactive to light.  Normal conjugate, extra-ocular eye movements in all directions of gaze.  No nystagmus.  No ptosis.   V:  Normal facial sensation.  VII:  Normal facial symmetry and movements.   VIII:  Normal hearing and vestibular function.   IX-X:  Normal palatal movement.   XI:  Normal shoulder shrug and head rotation.   XII:  Normal tongue strength and range of motion, no deviation or fasciculation.  MOTOR:  Muscle strength is 5/5 in all extremities.  No atrophy, fasciculations or abnormal movements.  No pronator drift.  Tone is normal.    MSRs:  Right                                                                 Left brachioradialis 2+  brachioradialis 2+  biceps 2+  biceps 2+  triceps 2+  triceps 2+  patellar 2+  patellar 2+  ankle jerk 2+  ankle jerk 2+   SENSORY:  Intact to light touch throughout, including over the left lateral thigh  COORDINATION/GAIT: Normal finger-to- nose-finger.  Able to rise  from a chair without using arms.  Gait narrow based and stable.   Data: MRI lumbar spine 02/26/2012: No cord abnormalities, no focal stenosis or herniation.  06/02/2012 labs: CRP (1.7), CK 76, ESR 4, TSH 1.770  07/14/2012 labs: Vitamin B12 262    IMPRESSION: Mrs. Bucaro is a 42 year-old female presents for follow-up of 1-year history of left thigh paresthesias.  At her last visit, findings were consistent with sensory changes over the distribution of the lateral femoral cutaneous nerve, however it is unusual that her symptoms do not involve the proximal anterolateral thigh, as the most common  area of entrapment is at the inguinal ligament. Still, the only explanation that I can determine is if the local injections for her varicose veins caused distal nerve irritation to the LCFN, but this is a stretch because symptom onset was several months following this procedure.  Symptoms did seem to improve with neurontin and overall she is doing much better.  No need for additional testing.   PLAN/RECOMMENDATIONS:  1.  Reduce neurontin to 100mg  at bedtime as needed for pain 2.  Return to clinic as needed   The duration of this appointment visit was 30 minutes of face-to-face time with the patient.  Greater than 50% of this time was spent in counseling, explanation of diagnosis, planning of further management, and coordination of care.   Thank you for allowing me to participate in patient's care.  If I can answer any additional questions, I would be pleased to do so.    Sincerely,    Donika K. Allena Katz, DO

## 2013-02-13 ENCOUNTER — Encounter: Payer: Self-pay | Admitting: Neurology

## 2013-02-13 ENCOUNTER — Ambulatory Visit (INDEPENDENT_AMBULATORY_CARE_PROVIDER_SITE_OTHER): Payer: BC Managed Care – PPO | Admitting: Neurology

## 2013-02-13 ENCOUNTER — Encounter (INDEPENDENT_AMBULATORY_CARE_PROVIDER_SITE_OTHER): Payer: Self-pay

## 2013-02-13 VITALS — BP 118/81 | HR 66 | Temp 98.0°F | Ht 69.0 in | Wt 144.5 lb

## 2013-02-13 DIAGNOSIS — G43009 Migraine without aura, not intractable, without status migrainosus: Secondary | ICD-10-CM

## 2013-02-13 HISTORY — DX: Migraine without aura, not intractable, without status migrainosus: G43.009

## 2013-02-13 MED ORDER — PREDNISONE 5 MG PO TABS: ORAL_TABLET | ORAL | Status: DC

## 2013-02-13 NOTE — Patient Instructions (Signed)
Migraine Headache A migraine headache is an intense, throbbing pain on one or both sides of your head. A migraine can last for 30 minutes to several hours. CAUSES  The exact cause of a migraine headache is not always known. However, a migraine may be caused when nerves in the brain become irritated and release chemicals that cause inflammation. This causes pain. SYMPTOMS  Pain on one or both sides of your head.  Pulsating or throbbing pain.  Severe pain that prevents daily activities.  Pain that is aggravated by any physical activity.  Nausea, vomiting, or both.  Dizziness.  Pain with exposure to bright lights, loud noises, or activity.  General sensitivity to bright lights, loud noises, or smells. Before you get a migraine, you may get warning signs that a migraine is coming (aura). An aura may include:  Seeing flashing lights.  Seeing bright spots, halos, or zig-zag lines.  Having tunnel vision or blurred vision.  Having feelings of numbness or tingling.  Having trouble talking.  Having muscle weakness. MIGRAINE TRIGGERS  Alcohol.  Smoking.  Stress.  Menstruation.  Aged cheeses.  Foods or drinks that contain nitrates, glutamate, aspartame, or tyramine.  Lack of sleep.  Chocolate.  Caffeine.  Hunger.  Physical exertion.  Fatigue.  Medicines used to treat chest pain (nitroglycerine), birth control pills, estrogen, and some blood pressure medicines. DIAGNOSIS  A migraine headache is often diagnosed based on:  Symptoms.  Physical examination.  A CT scan or MRI of your head. TREATMENT Medicines may be given for pain and nausea. Medicines can also be given to help prevent recurrent migraines.  HOME CARE INSTRUCTIONS  Only take over-the-counter or prescription medicines for pain or discomfort as directed by your caregiver. The use of long-term narcotics is not recommended.  Lie down in a dark, quiet room when you have a migraine.  Keep a journal  to find out what may trigger your migraine headaches. For example, write down:  What you eat and drink.  How much sleep you get.  Any change to your diet or medicines.  Limit alcohol consumption.  Quit smoking if you smoke.  Get 7 to 9 hours of sleep, or as recommended by your caregiver.  Limit stress.  Keep lights dim if bright lights bother you and make your migraines worse. SEEK IMMEDIATE MEDICAL CARE IF:   Your migraine becomes severe.  You have a fever.  You have a stiff neck.  You have vision loss.  You have muscular weakness or loss of muscle control.  You start losing your balance or have trouble walking.  You feel faint or pass out.  You have severe symptoms that are different from your first symptoms. MAKE SURE YOU:   Understand these instructions.  Will watch your condition.  Will get help right away if you are not doing well or get worse. Document Released: 03/09/2005 Document Revised: 06/01/2011 Document Reviewed: 02/27/2011 Exit[employer] Patient Information 2014 [employer].  

## 2013-02-13 NOTE — Progress Notes (Signed)
Reason for visit: Headache  Christy Porter is an 42 y.o. female  History of present illness:  [name] is a 42 year old left-handed white female with a history of migraine headache. The patient indicates that her headaches have been present off and on throughout her life, almost always on the left side of the head. The patient indicates that the headaches are usually frontotemporal in nature, but may have pain radiating behind the left eye. The patient occasionally will have visual phenomenon with some partial loss of the visual field off the right. The patient also reports " ice pick pains " that may occur in the left temporal or left parietal area, with sharp brief jabs of pain. The patient usually has on average one headache a month, and this may respond to Advil. The patient has taken Fioricet in the past without much benefit. The patient has never been on daily medication for her migraine. The patient reports no significant nausea or vomiting with the headache. Three weeks ago, the patient began having left frontotemporal headache that has been relatively persistent. The patient denies any photophobia or phonophobia with the headache. The patient denies any particular activating factors for the headache. The patient's mother also has a history of headache. The patient comes to this office for an evaluation. MRI the brain has been done at [facility], and she was told this was unremarkable.  The patient was seen previously for discomfort just above the left knee. The patient indicates that physical therapy has helped this discomfort. The patient is also being treated for left plantar fasciitis. The patient was felt to have issues involving the patella on the left that were the etiology of the leg pain.   Past Medical History  Diagnosis Date  . Diabetes mellitus     gest diabetes, diet controlled  . Hypertension   . Vitamin D deficiency   . HELLP syndrome   . Migraine without aura,  without mention of intractable migraine without mention of status migrainosus 02/13/2013    Past Surgical History  Procedure Laterality Date  . Cesarean section  '08  . Breast surgery      Family History  Problem Relation Age of Onset  . Hypertension Mother     Living  . Migraines Mother   . Lupus Maternal Grandmother   . Healthy Father     Living    Social history:  reports that she has never smoked. She has never used smokeless tobacco. She reports that she drinks alcohol. She reports that she does not use illicit drugs.   No Known Allergies  Medications:  Current Outpatient Prescriptions on File Prior to Visit  Medication Sig Dispense Refill  . hydrochlorothiazide 25 MG tablet Take 25 mg by mouth daily.        . Linaclotide (LINZESS) 290 MCG CAPS capsule Take 290 mcg by mouth once a week.      . potassium chloride SA (K-DUR,KLOR-CON) 20 MEQ tablet Take 20 mEq by mouth daily.       No current facility-administered medications on file prior to visit.    ROS:  Out of a complete 14 system review of symptoms, the patient complains only of the following symptoms, and all other reviewed systems are negative.   Headache Eye pain  Blood pressure 118/81, pulse 66, temperature 98 F (36.7 C), temperature source Oral, height 5\' 9"  (1.753 m), weight 144 lb 8 oz (65.545 kg).  Physical Exam  General: The patient is alert  and cooperative at the time of the examination.  Neck: No carotid bruits are noted.  Neuromuscular: Range of movement of the cervical spine is full.  Skin: No significant peripheral edema is noted.   Neurologic Exam  Mental status: The patient is oriented x 3.  Cranial nerves: Facial symmetry is present. Speech is normal, no aphasia or dysarthria is noted. Extraocular movements are full. Visual fields are full. Pupils are equal, round, and reactive to light. Discs are flat bilaterally.   Motor: The patient has good strength in all 4  extremities.  Sensory examination: soft touch sensation of the face, arms, and legs is symmetric.   Coordination: The patient has good finger-nose-finger and heel-to-shin bilaterally.  Gait and station: The patient has a normal gait. Tandem gait is normal. Romberg is negative. No drift is seen.  Reflexes: Deep tendon reflexes are symmetric.   Assessment/Plan:  1. Migraine headache  2. Left leg discomfort  The patient has had a long-standing history of migraine, but the patient currently has "converted migraine". The patient having more persistent headache, but headache is not extremely severe. The patient will be placed on a prednisone dosepak, and if this does not help, she is to contact our office. The patient fortunately is doing much better with her left leg discomfort that may have been related to a tendinopathy involving the patella. We will obtain the MRI brain results from [facility].  Marlan Palau MD 02/13/2013 8:14 PM  [facility] [address] [place], [place] [address]  Phone [phone] Fax [phone]

## 2013-02-22 ENCOUNTER — Telehealth: Payer: Self-pay | Admitting: Neurology

## 2013-02-22 NOTE — Telephone Encounter (Signed)
FINISHED PREDNISONE BUT STILL HAS CONSTANT PAIN IN HEAD

## 2013-02-24 MED ORDER — TOPIRAMATE 25 MG PO TABS: ORAL_TABLET | ORAL | Status: DC

## 2013-02-24 NOTE — Telephone Encounter (Signed)
Please advise 

## 2013-02-24 NOTE — Telephone Encounter (Signed)
Patient calling to follow up on this. Patient states that she hasn't heard anything back yet and would like some relief before the weekend. Please call the patient.

## 2013-02-24 NOTE — Telephone Encounter (Signed)
I called the patient. The patient got transient benefit with the prednisone, but the headache is still a problem in the left temporal area, and above the ear. I will call in Topamax for the headache.

## 2013-02-27 ENCOUNTER — Telehealth: Payer: Self-pay | Admitting: Neurology

## 2013-02-27 NOTE — Telephone Encounter (Signed)
Pt missed call and is asking if the sx she has (does she need depacon)?  She is having constant dull pain (focused at L (above ear).  Hurts to lie on it. (level 5). Cannot shake it.    Been on topamax since Friday.  [phone].

## 2013-02-27 NOTE — Telephone Encounter (Signed)
I called the patient. The patient still having headaches. I left a message. We will try to get the patient into our office today for a Depacon injection.

## 2013-02-27 NOTE — Telephone Encounter (Signed)
I called patient. The patient indicates that the headache is mainly around the ear at this time, not in the frontotemporal regions. The patient is getting on Topamax. The patient does not believe that the headache is debilitating. We will hold off on the Depacon injection at this time.

## 2013-02-27 NOTE — Telephone Encounter (Signed)
Called patient, left VM message of  scheduled depacon injection today at 1:30 per Dr Anne Hahn. Patient to call back to confirm appt.

## 2013-02-28 ENCOUNTER — Encounter: Payer: Self-pay | Admitting: Neurology

## 2013-02-28 ENCOUNTER — Ambulatory Visit (INDEPENDENT_AMBULATORY_CARE_PROVIDER_SITE_OTHER): Payer: BC Managed Care – PPO | Admitting: Neurology

## 2013-02-28 VITALS — BP 112/72 | HR 74 | Temp 97.2°F | Ht 69.0 in | Wt 140.6 lb

## 2013-02-28 DIAGNOSIS — R51 Headache: Secondary | ICD-10-CM

## 2013-02-28 MED ORDER — DIAZEPAM 5 MG PO TABS: ORAL_TABLET | ORAL | Status: DC

## 2013-02-28 MED ORDER — KETOROLAC TROMETHAMINE 30 MG/ML IJ SOLN: 30.0000 mg | Freq: Once | INTRAMUSCULAR | Status: DC

## 2013-02-28 NOTE — Progress Notes (Signed)
[facility] Neurology Division  Follow-up Visit   Date: 02/28/2013    Christy Porter MRN: [id number] DOB: May 02, 1970   Interim History: Christy Porter is a 42 y.o. year-old left-handed Caucasian female with history of HELLP syndrome and vitamin D deficiency returning to the clinic for a new problem of migraines.  She was last seen in the office on 01/12/2013 for atypical leg paresthesias. The patient was accompanied to the clinic by self.   History of headaches started in her teens, which improved after stopping birth control (30s).  Since then, she gets unilateral headaches located over the left temporal and parietal region which responds well to NSAIDs and Fioricet.  Headaches are not associated with photophobia, phonophobia, nausea, or vomiting. She has not been on a daily preventative medication.  Around November 2014, she developed tingling, "warm water sensation" over the left side of her head (pariental region, temple, inner ear).   She would have intermittent brief sharp pain over the same area and including her left eye.  She tried ibuprofen and fioricet which did not help.  Pain was not severe, but it would not go away.  She is still able to function with it.  She recently saw Dr. Anne Hahn on 11/24 for headaches at which time prednisone dosepak was started, but this did not help. Symptoms have become more constant lately with sharp pain over the left temple region.  Topamax 25mg  daily was started, but she has not noticed any change. She also started developing paresthesias over the left side of her face.   Denies any exacerbating or alleviating factors.  Denies any weakness. No auras, photophobia, phonophobia.  No autonomic features.  She does endorse a significant amount of stress in her life lately.  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   History of  present illness: She initally presented to the office on 12/13/2012 with left leg paresthesias.  Her symptoms started fairly abruptly ~24-months following the delivery of her second child (October 2013) when she woke up one morning and noticed that the left lateral thigh did not feel like it usually would. She describes the discomfort as a burning sensation localized to the mid-lateral thigh to right above the knee. There is no radiation of symptoms into the anterior, posterior, or upper lateral thigh. She has associated pain which is deep and achy and occasional tingling over the antero-lateral thigh. Symptoms are better in flat shoes and worse with heels. 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 She saw orthopeadic surgery initially because of concern of irritation of the IT band and was given a two week trial of steroids, but there was no relief.    Her pregnancy was complicated by HELLP syndrome and varicose veins. She underwent surgery for the large varicose vein over the left medial thigh in January 2013 and for the smaller veins over the left lateral thigh had injections placed. She does not know what was injected, but does report that her symptoms seem to be over the same area, but started several months following the procedure.In December 2014, she saw Dr. Anne Hahn at [facility] who performed EMG which was normal.   She also reports having a number of intermittent symptoms since the delivery of her second child such as numbness/tingling of her hands and left side of the  face, hearing and vision changes. She has been evaluated by ENT, rheumatology, and opthalmology who did not find any abnormalities. She also saw physical therapy because of the sensation of left leg weakness and tightness and was told that her left leg appeared to have muscle atrophy.    01/12/2013 Follow-up of left leg symptoms:   left leg discomfort has improved by  75% and it is no longer constant.  She tried neurontin 200mg  at bedtime which seemed to have helped, but made her sleepy.  Medications:  Current Outpatient Prescriptions on File Prior to Visit  Medication Sig Dispense Refill  . hydrochlorothiazide 25 MG tablet Take 25 mg by mouth daily.        . Linaclotide (LINZESS) 290 MCG CAPS capsule Take 290 mcg by mouth once a week.      . potassium chloride SA (K-DUR,KLOR-CON) 20 MEQ tablet Take 20 mEq by mouth daily.      Marland Kitchen topiramate (TOPAMAX) 25 MG tablet Take one tablet at night for one week, then take 2 tablets at night for one week, then take 3 tablets at night.  90 tablet  3   No current facility-administered medications on file prior to visit.    Allergies: No Known Allergies   Review of Systems:  CONSTITUTIONAL: No fevers, chills, night sweats, or weight loss.   EYES: No visual changes or eye pain ENT: No hearing changes.  No history of nose bleeds.   RESPIRATORY: No cough, wheezing and shortness of breath.   CARDIOVASCULAR: Negative for chest pain, and palpitations.   GI: Negative for abdominal discomfort, blood in stools or black stools.  No recent change in bowel habits.   GU:  No history of incontinence.   MUSCLOSKELETAL: No history of joint pain or swelling.  No myalgias.   SKIN: Negative for lesions, rash, and itching.   HEMATOLOGY/ONCOLOGY: Negative for prolonged bleeding, bruising easily, and swollen nodes.  ENDOCRINE: Negative for cold or heat intolerance, polydipsia or goiter.   PSYCH:  No depression or anxiety symptoms.   NEURO: As Above.   Vital Signs:  BP 112/72  Pulse 74  Temp(Src) 97.2 F (36.2 C) (Oral)  Ht 5\' 9"  (1.753 m)  Wt 140 lb 9.6 oz (63.776 kg)  BMI 20.75 kg/m2 Pain Scale: 0 on a scale of 0-10    Neurological Exam: MENTAL STATUS including orientation to time, place, person, recent and remote memory, attention span and concentration, language, and fund of knowledge is normal.  Speech is not  dysarthric.  CRANIAL NERVES: II:  No visual field defects.  Unremarkable fundi. III-IV-VI: Pupils equal round and reactive to light.  Normal conjugate, extra-ocular eye movements in all directions of gaze.  No nystagmus.  No ptosis.   V:  Normal facial sensation.  VII:  Normal facial symmetry and movements.   VIII:  Normal hearing and vestibular function.   IX-X:  Normal palatal movement.   XI:  Normal shoulder shrug and head rotation.  Cervical muscles are supple without increased tension. XII:  Normal tongue strength and range of motion, no deviation or fasciculation.  MOTOR:  Muscle strength is 5/5 in all extremities.  No atrophy, fasciculations or abnormal movements.  No pronator drift.  Tone is normal.    MSRs:  Reflexes are 2+/4 throughout.  SENSORY:  Intact to light touch and vibration throughout  COORDINATION/GAIT: Normal finger-to- nose-finger.  Able to rise from a chair without using arms.  Gait narrow based and stable.   Data: MRI lumbar  spine 02/26/2012: No cord abnormalities, no focal stenosis or herniation.  06/02/2012 labs: CRP (1.7), CK 76, ESR 4, TSH 1.770  07/14/2012 labs: Vitamin B12 262   CT head wo contrast 10/22/2011: Normal CT of the head without contrast.   IMPRESSION: 1.  Migraine headaches - new problem  - Side-locked head pain with facial paresthesias  - Exam is non-focal  - Given new onset pain with atypical features, will obtain MRI brain  - Recently started on topamax 25mg  without any change (has not titrated dose)  - Trial of prednisone did not provide relief 2.  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  MRI brain wwo contrast 2.  Toradol 30mg  IM injection today 3.  Take topamax as follows:  Week 1:  25mg  at bedtime  Week 2:  50mg  at bedtime  Week 3:  75 mg at bedtime 4.  Contact the office with an update in 3 weeks   The  duration of this appointment visit was 40 minutes of face-to-face time with the patient.  Greater than 50% of this time was spent in counseling, explanation of diagnosis, planning of further management, and coordination of care.   Thank you for allowing me to participate in patient's care.  If I can answer any additional questions, I would be pleased to do so.    Sincerely,    Donika K. Allena Katz, DO

## 2013-02-28 NOTE — Addendum Note (Signed)
Addended by: Samantha Crimes on: 02/28/2013 04:55 PM   Modules accepted: Orders

## 2013-02-28 NOTE — Patient Instructions (Addendum)
1.  MRI brain wwo contrast.  Prescription for valium 5mg  prior to MRI brain has been given. 2.  Toradol 30mg  IM injection today 3.  Take topamax as follows:  Week 1:  25mg  at bedtime  Week 2:  50mg  at bedtime  Week 3:  75 mg at bedtime 4.  Contact the office with an update in 3 weeks

## 2013-03-03 ENCOUNTER — Telehealth: Payer: Self-pay | Admitting: *Deleted

## 2013-03-03 NOTE — Telephone Encounter (Signed)
Noted.  Donika K. Patel, DO   

## 2013-03-03 NOTE — Telephone Encounter (Signed)
Spoke with patient about Mri appointment she stated that she had a headache on Wednesday and went to her PCP she did a stat Mri of the Brain at that time. She will bring the disk on Monday. I have called to cancel the Mri I scheduled.

## 2013-03-07 ENCOUNTER — Telehealth: Payer: Self-pay | Admitting: Neurology

## 2013-03-07 NOTE — Telephone Encounter (Signed)
Called patient to discuss results of MRI brain, but there was no answer.  Message left on voicemail.  Donika K. Allena Katz, DO

## 2013-03-08 ENCOUNTER — Telehealth: Payer: Self-pay | Admitting: Neurology

## 2013-03-08 ENCOUNTER — Ambulatory Visit (HOSPITAL_COMMUNITY): Payer: BC Managed Care – PPO

## 2013-03-08 NOTE — Telephone Encounter (Signed)
MRI brain performed at [facility]  Report dated 03/02/2013, findings as follows:  1.  Minimal nonspecific hemispheric white matter changes.  Lesion and distribution is not highly characteristic of the usual appearance of multiple sclerosis. 2.  Changes of chronic or allergic sinusitis, predominant to the right maxillary sinus. 3.  No acute intracranial changes.  Christy Porter K. Allena Katz, DO

## 2013-07-31 ENCOUNTER — Encounter: Payer: Self-pay | Admitting: Neurology

## 2013-08-10 ENCOUNTER — Other Ambulatory Visit: Payer: Self-pay

## 2013-08-10 DIAGNOSIS — Z1231 Encounter for screening mammogram for malignant neoplasm of breast: Secondary | ICD-10-CM

## 2013-08-21 ENCOUNTER — Ambulatory Visit: Payer: BC Managed Care – PPO | Admitting: Neurology

## 2013-08-23 ENCOUNTER — Ambulatory Visit
Admission: RE | Admit: 2013-08-23 | Discharge: 2013-08-23 | Disposition: A | Discharge status: Home / Self Care | Payer: BC Managed Care – PPO | Source: Ambulatory Visit

## 2013-08-23 DIAGNOSIS — Z1231 Encounter for screening mammogram for malignant neoplasm of breast: Secondary | ICD-10-CM

## 2013-10-02 ENCOUNTER — Telehealth: Payer: Self-pay | Admitting: *Deleted

## 2013-10-02 NOTE — Telephone Encounter (Signed)
Spoke with patient because she is on the wait list for an appointment with Dr. Jannifer Franklin, patient stated that she did not really need an appointment at this time because she found out that her condition was related to an injury, she wanted to keep her appointment with Dr. Jannifer Franklin on 02/08/14 at 9 am, states that she will call back if anything changes.

## 2014-01-22 ENCOUNTER — Encounter: Payer: Self-pay | Admitting: Neurology

## 2014-02-08 ENCOUNTER — Ambulatory Visit: Payer: BC Managed Care – PPO | Admitting: Neurology

## 2014-02-13 ENCOUNTER — Other Ambulatory Visit: Payer: Self-pay | Admitting: Obstetrics and Gynecology

## 2014-02-13 DIAGNOSIS — N6452 Nipple discharge: Secondary | ICD-10-CM

## 2014-02-13 DIAGNOSIS — N644 Mastodynia: Secondary | ICD-10-CM

## 2014-03-01 ENCOUNTER — Other Ambulatory Visit: Payer: BC Managed Care – PPO

## 2014-03-09 ENCOUNTER — Other Ambulatory Visit: Payer: Self-pay | Admitting: Obstetrics and Gynecology

## 2014-06-01 ENCOUNTER — Other Ambulatory Visit: Payer: Self-pay | Admitting: Otolaryngology

## 2014-06-01 DIAGNOSIS — M542 Cervicalgia: Secondary | ICD-10-CM

## 2014-06-06 ENCOUNTER — Ambulatory Visit
Admission: RE | Admit: 2014-06-06 | Discharge: 2014-06-06 | Disposition: A | Discharge status: Home / Self Care | Payer: BLUE CROSS/BLUE SHIELD | Source: Ambulatory Visit | Attending: Otolaryngology | Admitting: Otolaryngology

## 2014-06-06 DIAGNOSIS — M542 Cervicalgia: Secondary | ICD-10-CM

## 2014-06-06 MED ORDER — IOPAMIDOL (ISOVUE-300) INJECTION 61%
75.0000 mL | Freq: Once | INTRAVENOUS | Status: AC | PRN
Start: 2014-06-06 — End: 2014-06-06
  Administered 2014-06-06: 75 mL via INTRAVENOUS

## 2014-06-11 ENCOUNTER — Ambulatory Visit: Payer: BC Managed Care – PPO | Admitting: Neurology

## 2014-07-26 ENCOUNTER — Other Ambulatory Visit: Payer: Self-pay | Admitting: Otolaryngology

## 2014-07-26 DIAGNOSIS — M542 Cervicalgia: Secondary | ICD-10-CM

## 2014-08-13 ENCOUNTER — Ambulatory Visit
Admission: RE | Admit: 2014-08-13 | Discharge: 2014-08-13 | Disposition: A | Discharge status: Home / Self Care | Payer: Managed Care, Other (non HMO) | Source: Ambulatory Visit | Attending: Otolaryngology | Admitting: Otolaryngology

## 2014-08-13 DIAGNOSIS — M542 Cervicalgia: Secondary | ICD-10-CM

## 2014-08-27 ENCOUNTER — Other Ambulatory Visit: Payer: Self-pay

## 2014-08-27 DIAGNOSIS — Z9889 Other specified postprocedural states: Secondary | ICD-10-CM

## 2014-08-27 DIAGNOSIS — Z1231 Encounter for screening mammogram for malignant neoplasm of breast: Secondary | ICD-10-CM

## 2014-09-21 ENCOUNTER — Ambulatory Visit
Admission: RE | Admit: 2014-09-21 | Discharge: 2014-09-21 | Disposition: A | Discharge status: Home / Self Care | Payer: Managed Care, Other (non HMO) | Source: Ambulatory Visit

## 2014-09-21 DIAGNOSIS — Z9889 Other specified postprocedural states: Secondary | ICD-10-CM

## 2014-09-21 DIAGNOSIS — Z1231 Encounter for screening mammogram for malignant neoplasm of breast: Secondary | ICD-10-CM

## 2014-09-21 HISTORY — PX: BREAST BIOPSY: SHX20

## 2014-09-25 ENCOUNTER — Other Ambulatory Visit: Payer: Self-pay | Admitting: Obstetrics and Gynecology

## 2014-09-25 DIAGNOSIS — N6452 Nipple discharge: Secondary | ICD-10-CM

## 2014-10-03 ENCOUNTER — Other Ambulatory Visit: Payer: Self-pay | Admitting: Obstetrics and Gynecology

## 2014-10-03 ENCOUNTER — Ambulatory Visit
Admission: RE | Admit: 2014-10-03 | Discharge: 2014-10-03 | Disposition: A | Discharge status: Home / Self Care | Payer: Managed Care, Other (non HMO) | Source: Ambulatory Visit | Attending: Obstetrics and Gynecology | Admitting: Obstetrics and Gynecology

## 2014-10-03 DIAGNOSIS — N6452 Nipple discharge: Secondary | ICD-10-CM

## 2014-10-04 ENCOUNTER — Ambulatory Visit
Admission: RE | Admit: 2014-10-04 | Discharge: 2014-10-04 | Disposition: A | Discharge status: Home / Self Care | Payer: Managed Care, Other (non HMO) | Source: Ambulatory Visit | Attending: Obstetrics and Gynecology | Admitting: Obstetrics and Gynecology

## 2014-10-04 ENCOUNTER — Other Ambulatory Visit: Payer: Self-pay | Admitting: Obstetrics and Gynecology

## 2014-10-04 DIAGNOSIS — N6452 Nipple discharge: Secondary | ICD-10-CM

## 2014-10-04 DIAGNOSIS — N6320 Unspecified lump in the left breast, unspecified quadrant: Secondary | ICD-10-CM

## 2014-10-11 ENCOUNTER — Other Ambulatory Visit: Payer: Managed Care, Other (non HMO)

## 2014-10-23 ENCOUNTER — Other Ambulatory Visit: Payer: Self-pay | Admitting: Obstetrics and Gynecology

## 2014-10-23 DIAGNOSIS — N6452 Nipple discharge: Secondary | ICD-10-CM

## 2014-10-25 ENCOUNTER — Ambulatory Visit
Admission: RE | Admit: 2014-10-25 | Discharge: 2014-10-25 | Disposition: A | Discharge status: Home / Self Care | Payer: Managed Care, Other (non HMO) | Source: Ambulatory Visit | Attending: Obstetrics and Gynecology | Admitting: Obstetrics and Gynecology

## 2014-10-25 DIAGNOSIS — N6452 Nipple discharge: Secondary | ICD-10-CM

## 2014-11-12 ENCOUNTER — Ambulatory Visit: Payer: Self-pay | Admitting: Surgery

## 2014-11-12 DIAGNOSIS — N6320 Unspecified lump in the left breast, unspecified quadrant: Secondary | ICD-10-CM

## 2014-11-12 NOTE — H&P (Signed)
Christy Porter. Beebe 11/12/2014 11:39 AM Location: [facility] Patient #: [id number] DOB: 1971/01/14 Married / Language: Christy Porter / Race: White Female History of Present Illness Marcello Moores A. Ja Ohman MD; 11/12/2014 12:16 PM) Patient words: new-nipple discharge   Pt sent at the request of Dr Curlene Dolphin for bloody left nipple discahrge that started december 2105. This was biopsied and showed fibrocystic disease. It stopped and restarted this summer. Ductogram showed a small filling defect or debris 4 oclock left nipple subareolar and clip separated from this. Pt has history of bilateral breast reduction in 2000. The area gets sore as well. Discharge has been bloody and has stained garments. Intermittent at times.       Breast, left, needle core biopsy, 4 o'clock - FIBROCYSTIC CHANGES. - THERE IS NO EVIDENCE OF MALIGNANCY. - SEE COMMENT.        CLINICAL DATA: 44 year old patient recently presented for evaluation of bloody left nipple discharge which first occurred in December of 2015. She has had a history in the past of milky bilateral nipple discharge. History of bilateral breast reduction in her 42s. On 10/03/2014, ultrasound of the breasts was performed. In the 4 o'clock position of the left breast a dilated duct containing debris versus less likely intraductal mass was imaged. It was decided to perform biopsy given the history of bloody discharge. Pathology revealed fibrocystic changes. The patient states that approximately 2 weeks after the biopsy, she again had brownish discharge at home, and was evaluated by her physician and again bloody discharge was seen. She presents for repeat left breast ultrasound and possible ductogram. EXAM: ULTRASOUND OF THE LEFT BREAST COMPARISON: Previous exam(s). FINDINGS: On physical exam, the patient is able to elicit bloody nipple discharge from a single duct orifice of the left nipple with manual expression today. Targeted  ultrasound is performed. A rounded soft tissue filling defect is seen immediately subjacent to the nipple at 4 o'clock position subareolar. This intraductal mass measures 4 x 2 x 2 mm. On color Doppler imaging, I do not see vascular flow within this intraductal mass. Slightly further from the nipple in the 4 o'clock retroareolar left breast 2 cm from the nipple, 2 echogenic lines are seen within a dilated duct, consistent with the biopsy clip placed on 10/04/2014 at the time of biopsy. No discrete intraductal soft tissue or nodule is seen in the region of recent biopsy. IMPRESSION: 4 x 2 x 2 mm intraductal soft tissue nodule versus debris is seen in the immediate retroareolar left breast, 4 o'clock position. This is more proximal to the nipple than the recently biopsied intraductal mass versus debris on 10/03/2014, with pathology results showing benign fibrocystic changes. Definite bloody nipple discharge is visualized on exam today. Attempt will be made at a ductogram. RECOMMENDATION: Ductogram will be attempted today and dictated separately. Surgical consultation is also recommended, given the persistent bloody nipple discharge and intraductal mass, for which papilloma and malignancy cannot be excluded. The patient is scheduled Dr. Brantley Stage at on 11/12/2014. We also discussed the possibility of a second ultrasound-guided biopsy of the retroareolar intraductal mass versus debris seen on today's exam. The patient and I planned that she will have surgical consultation first, and if biopsy is felt to be helpful for surgical planning, ultrasound-guided biopsy could be ordered. I have discussed the findings and recommendations with the patient. Results were also provided in writing at the conclusion of the visit. If applicable, a reminder letter will be sent to the patient regarding the next appointment. BI-RADS  CATEGORY 4: Suspicious abnormality - biopsy should be considered. Electronically Signed By:  Curlene Dolphin M.D. On: 10/25/2014 16:30     Vitals Height Weight BMI (Calculated)   Interpretation Summary CLINICAL DATA: 44 year old patient recently presented for evaluation of bloody left nipple discharge which first occurred in December of 2015. She has had a history in the past of milky bilateral nipple discharge. History of bilateral breast reduction in her 42s. On 10/03/2014, ultrasound of the breasts was performed. In the 4 o'clock position of the left breast a dilated duct containing debris versus less likely intraductal mass was imaged. It was decided to perform biopsy given the history of bloody discharge. Pathology revealed fibrocystic changes. The patient states that approximately 2 weeks after the biopsy, she again had brownish discharge at home, and was evaluated by her physician and again bloody discharge was seen. She presents for repeat left breast ultrasound and possible ductogram. EXAM: ULTRASOUND OF THE LEFT BREAST COMPARISON: Previous exam(s). FINDINGS: On physical exam, the patient is able to elicit bloody nipple discharge from a single duct orifice of the left nipple with manual expression today. Targeted ultrasound is performed. A rounded soft tissue filling defect is seen immediately subjacent to the nipple at 4 o'clock position subareolar. This intraductal mass measures 4 x 2 x 2 mm. On color Doppler imaging, I do not see vascular flow within this intraductal mass. Slightly further from the nipple in the 4 o'clock retroareolar left breast 2 cm from the nipple, 2 echogenic lines are seen within a dilated duct, consistent with the biopsy clip placed on 10/04/2014 at the time of biopsy. No discrete intraductal soft tissue or nodule is seen in the region of recent biopsy. IMPRESSION: 4 x 2 x 2 mm intraductal soft tissue nodule versus debris is seen in the immediate retroareolar left breast, 4 o'clock position. This is more proximal to the nipple than the recently  biopsied intraductal mass versus debris on 10/03/2014, with pathology results showing benign fibrocystic changes. Definite bloody nipple discharge is visualized on exam today. Attempt will be made at a ductogram. RECOMMENDATION: Ductogram will be attempted today and dictated separately. Surgical consultation is also recommended, given the persistent bloody nipple discharge and intraductal mass, for which papilloma and malignancy cannot be excluded. The patient is scheduled Dr. Brantley Stage at on 11/12/2014. We also discussed the possibility of a second ultrasound-guided biopsy of the retroareolar intraductal mass versus debris seen on today's exam. The patient and I planned that she will have surgical consultation first, and if biopsy is felt to be helpful for surgical planning, ultrasound-guided biopsy could be ordered. I have discussed the findings and recommendations with the patient. Results were also provided in writing at the conclusion of the visit. If applicable, a reminder letter will be sent to the patient regarding the next appointment. BI-RADS CATEGORY 4: Suspicious abnormality - biopsy should be considered. Electronically Signed By: Curlene Dolphin M.D. On: 10/25/2014 16:30.  The patient is a 44 year old female   Allergies Malachi Bonds; 11/12/2014 11:40 AM) No Known Drug Allergies 11/12/2014  Medication History (Chemira Jones; 11/12/2014 11:41 AM) Linzess (290MCG Capsule, Oral) Active. Diazepam (5MG  Tablet, Oral) Active. Potassium Bicarbonate (20MEQ Tablet Effer, Oral) Active. Topamax (25MG  Tablet, Oral) Active. Medications Reconciled    Vitals (Chemira Jones; 11/12/2014 11:40 AM) 11/12/2014 11:40 AM Weight: 138.2 lb Height: 69in Body Surface Area: 1.75 m Body Mass Index: 20.41 kg/m Temp.: 98.38F(Oral)  Pulse: 66 (Regular)  BP: 112/76 (Sitting, Left Arm, Standard)  Physical Exam (Liviya Santini A. Harding Thomure MD; 11/12/2014 12:18 PM)  General Mental  Status-Alert. General Appearance-Consistent with stated age. Hydration-Well hydrated. Voice-Normal.  Head and Neck Head-normocephalic, atraumatic with no lesions or palpable masses. Trachea-midline. Thyroid Gland Characteristics - normal size and consistency.  Eye Eyeball - Bilateral-Extraocular movements intact. Sclera/Conjunctiva - Bilateral-No scleral icterus.  Chest and Lung Exam Inspection Chest Wall - Normal. Shape - Normal. Auscultation Vocal resonance - Normal.  Breast Note: scars from reduction noted bilaterally left nupple discharge cloudy to clear non bloody. no mases right breast without mass no discharge. reductio scars noted.   Neurologic Neurologic evaluation reveals -alert and oriented x 3 with no impairment of recent or remote memory. Mental Status-Normal.  Musculoskeletal Normal Exam - Left-Upper Extremity Strength Normal and Lower Extremity Strength Normal. Normal Exam - Right-Upper Extremity Strength Normal and Lower Extremity Strength Normal.  Lymphatic Axillary  General Axillary Region: Bilateral - Description - Normal. Tenderness - Non Tender.    Assessment & Plan (Oyindamola Key A. Rayvon Brandvold MD; 11/12/2014 12:20 PM)  NIPPLE DISCHARGE, BLOODY (611.79  N64.52) Impression: RECOMMEND LEFT BREAST CENTRAL DUCT EXCISON with seed localization Risk of lumpectomy include bleeding, infection, seroma, nipple loss, more surgery, use of seed/wire, wound care, cosmetic deformity and the need for other treatments, death , blood clots, death. Discussed possible nipple loss and cosmetic issues as well. Pt agrees to proceed.  Current Plans Pt Education - [facility] Free Text Education/Instructions: discussed with patient and provided information. Pt Education - CSS Breast Biopsy Instructions (FLB): discussed with patient and provided information.   The anatomy and the physiology was discussed. The pathophysiology and natural history of the disease was  discussed. Options were discussed and recommendations were made. Technique, risks, benefits, & alternatives were discussed. Risks such as stroke, heart attack, bleeding, indection, death, and other risks discussed. Questions answered. The patient agrees to proceed. Pt Education - Surgical management of pathologic nipple discharge: discussed with patient and provided information. Pt Education - Nipple discharge: discussed with patient and provided information. LEFT BREAST MASS (611.72  N63)

## 2014-11-14 ENCOUNTER — Ambulatory Visit: Payer: Self-pay | Admitting: Neurology

## 2014-11-15 ENCOUNTER — Other Ambulatory Visit: Payer: Self-pay | Admitting: Surgery

## 2014-11-15 DIAGNOSIS — N6320 Unspecified lump in the left breast, unspecified quadrant: Secondary | ICD-10-CM

## 2014-11-22 HISTORY — PX: BREAST EXCISIONAL BIOPSY: SUR124

## 2014-11-29 ENCOUNTER — Encounter (HOSPITAL_BASED_OUTPATIENT_CLINIC_OR_DEPARTMENT_OTHER): Payer: Self-pay | Admitting: *Deleted

## 2014-12-04 ENCOUNTER — Encounter (HOSPITAL_BASED_OUTPATIENT_CLINIC_OR_DEPARTMENT_OTHER)
Admission: RE | Admit: 2014-12-04 | Discharge: 2014-12-04 | Disposition: A | Discharge status: Home / Self Care | Payer: Managed Care, Other (non HMO) | Source: Ambulatory Visit | Attending: Surgery | Admitting: Surgery

## 2014-12-04 ENCOUNTER — Other Ambulatory Visit: Payer: Self-pay

## 2014-12-04 DIAGNOSIS — N6452 Nipple discharge: Secondary | ICD-10-CM | POA: Diagnosis not present

## 2014-12-04 DIAGNOSIS — Z79899 Other long term (current) drug therapy: Secondary | ICD-10-CM | POA: Diagnosis not present

## 2014-12-04 DIAGNOSIS — D242 Benign neoplasm of left breast: Secondary | ICD-10-CM | POA: Diagnosis not present

## 2014-12-04 DIAGNOSIS — N63 Unspecified lump in breast: Secondary | ICD-10-CM | POA: Diagnosis present

## 2014-12-04 DIAGNOSIS — I10 Essential (primary) hypertension: Secondary | ICD-10-CM | POA: Diagnosis not present

## 2014-12-04 LAB — BASIC METABOLIC PANEL
ANION GAP: 8 (ref 5–15)
BUN: 21 mg/dL — AB (ref 6–20)
CHLORIDE: 100 mmol/L — AB (ref 101–111)
CO2: 28 mmol/L (ref 22–32)
Calcium: 9 mg/dL (ref 8.9–10.3)
Creatinine, Ser: 1.07 mg/dL — ABNORMAL HIGH (ref 0.44–1.00)
Glucose, Bld: 99 mg/dL (ref 65–99)
POTASSIUM: 3.4 mmol/L — AB (ref 3.5–5.1)
SODIUM: 136 mmol/L (ref 135–145)

## 2014-12-06 ENCOUNTER — Ambulatory Visit
Admission: RE | Admit: 2014-12-06 | Discharge: 2014-12-06 | Disposition: A | Discharge status: Home / Self Care | Payer: Managed Care, Other (non HMO) | Source: Ambulatory Visit | Attending: Surgery | Admitting: Surgery

## 2014-12-06 DIAGNOSIS — N6320 Unspecified lump in the left breast, unspecified quadrant: Secondary | ICD-10-CM

## 2014-12-07 ENCOUNTER — Ambulatory Visit (HOSPITAL_BASED_OUTPATIENT_CLINIC_OR_DEPARTMENT_OTHER): Payer: Managed Care, Other (non HMO) | Admitting: Anesthesiology

## 2014-12-07 ENCOUNTER — Ambulatory Visit (HOSPITAL_BASED_OUTPATIENT_CLINIC_OR_DEPARTMENT_OTHER)
Admission: RE | Admit: 2014-12-07 | Discharge: 2014-12-07 | Disposition: A | Discharge status: Home / Self Care | Payer: Managed Care, Other (non HMO) | Source: Ambulatory Visit | Attending: Surgery | Admitting: Surgery

## 2014-12-07 ENCOUNTER — Ambulatory Visit
Admission: RE | Admit: 2014-12-07 | Discharge: 2014-12-07 | Disposition: A | Discharge status: Home / Self Care | Payer: Managed Care, Other (non HMO) | Source: Ambulatory Visit | Attending: Surgery | Admitting: Surgery

## 2014-12-07 ENCOUNTER — Encounter (HOSPITAL_BASED_OUTPATIENT_CLINIC_OR_DEPARTMENT_OTHER): Payer: Self-pay | Admitting: Anesthesiology

## 2014-12-07 ENCOUNTER — Encounter (HOSPITAL_BASED_OUTPATIENT_CLINIC_OR_DEPARTMENT_OTHER)
Admission: RE | Disposition: A | Discharge status: Home / Self Care | Payer: Self-pay | Source: Ambulatory Visit | Attending: Surgery

## 2014-12-07 DIAGNOSIS — D242 Benign neoplasm of left breast: Secondary | ICD-10-CM | POA: Insufficient documentation

## 2014-12-07 DIAGNOSIS — I10 Essential (primary) hypertension: Secondary | ICD-10-CM | POA: Insufficient documentation

## 2014-12-07 DIAGNOSIS — Z79899 Other long term (current) drug therapy: Secondary | ICD-10-CM | POA: Insufficient documentation

## 2014-12-07 DIAGNOSIS — N6320 Unspecified lump in the left breast, unspecified quadrant: Secondary | ICD-10-CM

## 2014-12-07 DIAGNOSIS — N6452 Nipple discharge: Secondary | ICD-10-CM | POA: Insufficient documentation

## 2014-12-07 HISTORY — PX: BREAST LUMPECTOMY WITH RADIOACTIVE SEED LOCALIZATION: SHX6424

## 2014-12-07 HISTORY — DX: Other specified postprocedural states: R11.2

## 2014-12-07 HISTORY — DX: Other specified postprocedural states: Z98.890

## 2014-12-07 LAB — POCT HEMOGLOBIN-HEMACUE: Hemoglobin: 14 g/dL (ref 12.0–15.0)

## 2014-12-07 SURGERY — BREAST LUMPECTOMY WITH RADIOACTIVE SEED LOCALIZATION
Anesthesia: General | Laterality: Left

## 2014-12-07 MED ORDER — DEXAMETHASONE SODIUM PHOSPHATE 4 MG/ML IJ SOLN
INTRAMUSCULAR | Status: DC | PRN
  Administered 2014-12-07: 10 mg via INTRAVENOUS

## 2014-12-07 MED ORDER — FENTANYL CITRATE (PF) 100 MCG/2ML IJ SOLN
INTRAMUSCULAR | Status: DC | PRN
  Administered 2014-12-07 (×2): 50 ug via INTRAVENOUS

## 2014-12-07 MED ORDER — LIDOCAINE HCL (CARDIAC) 20 MG/ML IV SOLN
INTRAVENOUS | Status: DC | PRN
  Administered 2014-12-07: 50 mg via INTRAVENOUS

## 2014-12-07 MED ORDER — FENTANYL CITRATE (PF) 100 MCG/2ML IJ SOLN
INTRAMUSCULAR | Status: AC
  Filled 2014-12-07: qty 4

## 2014-12-07 MED ORDER — HYDROMORPHONE HCL 1 MG/ML IJ SOLN
INTRAMUSCULAR | Status: AC
  Filled 2014-12-07: qty 1

## 2014-12-07 MED ORDER — MIDAZOLAM HCL 2 MG/2ML IJ SOLN: 1.0000 mg | INTRAMUSCULAR | Status: DC | PRN

## 2014-12-07 MED ORDER — FENTANYL CITRATE (PF) 100 MCG/2ML IJ SOLN: 50.0000 ug | INTRAMUSCULAR | Status: DC | PRN

## 2014-12-07 MED ORDER — DEXTROSE 5 % IV SOLN
3.0000 g | INTRAVENOUS | Status: AC
  Administered 2014-12-07: 2 g via INTRAVENOUS

## 2014-12-07 MED ORDER — SCOPOLAMINE 1 MG/3DAYS TD PT72: 1.0000 | MEDICATED_PATCH | Freq: Once | TRANSDERMAL | Status: DC | PRN

## 2014-12-07 MED ORDER — PROPOFOL 10 MG/ML IV BOLUS
INTRAVENOUS | Status: DC | PRN
  Administered 2014-12-07: 200 mg via INTRAVENOUS

## 2014-12-07 MED ORDER — ONDANSETRON HCL 4 MG/2ML IJ SOLN
INTRAMUSCULAR | Status: DC | PRN
  Administered 2014-12-07: 4 mg via INTRAVENOUS

## 2014-12-07 MED ORDER — MIDAZOLAM HCL 2 MG/2ML IJ SOLN
INTRAMUSCULAR | Status: AC
  Filled 2014-12-07: qty 4

## 2014-12-07 MED ORDER — SCOPOLAMINE 1 MG/3DAYS TD PT72
MEDICATED_PATCH | TRANSDERMAL | Status: AC
Start: 2014-12-07 — End: 2014-12-07
  Filled 2014-12-07: qty 1

## 2014-12-07 MED ORDER — SCOPOLAMINE 1 MG/3DAYS TD PT72
MEDICATED_PATCH | TRANSDERMAL | Status: DC | PRN
  Administered 2014-12-07: 1 via TRANSDERMAL

## 2014-12-07 MED ORDER — CHLORHEXIDINE GLUCONATE 4 % EX LIQD: 1.0000 "application " | Freq: Once | CUTANEOUS | Status: DC

## 2014-12-07 MED ORDER — LACTATED RINGERS IV SOLN
INTRAVENOUS | Status: DC
  Administered 2014-12-07 (×2): via INTRAVENOUS

## 2014-12-07 MED ORDER — HYDROMORPHONE HCL 1 MG/ML IJ SOLN
0.2500 mg | INTRAMUSCULAR | Status: DC | PRN
  Administered 2014-12-07 (×2): 0.25 mg via INTRAVENOUS
  Administered 2014-12-07 (×2): 0.5 mg via INTRAVENOUS

## 2014-12-07 MED ORDER — MIDAZOLAM HCL 5 MG/5ML IJ SOLN
INTRAMUSCULAR | Status: DC | PRN
  Administered 2014-12-07: 2 mg via INTRAVENOUS

## 2014-12-07 MED ORDER — ONDANSETRON HCL 4 MG/2ML IJ SOLN
INTRAMUSCULAR | Status: AC
  Filled 2014-12-07: qty 2

## 2014-12-07 MED ORDER — PROPOFOL 10 MG/ML IV BOLUS
INTRAVENOUS | Status: AC
  Filled 2014-12-07: qty 20

## 2014-12-07 MED ORDER — GLYCOPYRROLATE 0.2 MG/ML IJ SOLN: 0.2000 mg | Freq: Once | INTRAMUSCULAR | Status: DC | PRN

## 2014-12-07 MED ORDER — HYDROCODONE-ACETAMINOPHEN 5-325 MG PO TABS: 1.0000 | ORAL_TABLET | Freq: Four times a day (QID) | ORAL | Status: DC | PRN

## 2014-12-07 MED ORDER — BUPIVACAINE-EPINEPHRINE (PF) 0.25% -1:200000 IJ SOLN
INTRAMUSCULAR | Status: DC | PRN
  Administered 2014-12-07: 3 mL via PERINEURAL

## 2014-12-07 MED ORDER — CEFAZOLIN SODIUM-DEXTROSE 2-3 GM-% IV SOLR
INTRAVENOUS | Status: AC
  Filled 2014-12-07: qty 50

## 2014-12-07 MED ORDER — LIDOCAINE HCL (CARDIAC) 20 MG/ML IV SOLN
INTRAVENOUS | Status: AC
  Filled 2014-12-07: qty 5

## 2014-12-07 SURGICAL SUPPLY — 46 items
APPLIER CLIP 9.375 MED OPEN (MISCELLANEOUS)
BINDER BREAST LRG (GAUZE/BANDAGES/DRESSINGS) IMPLANT
BINDER BREAST MEDIUM (GAUZE/BANDAGES/DRESSINGS) ×3 IMPLANT
BINDER BREAST XLRG (GAUZE/BANDAGES/DRESSINGS) IMPLANT
BINDER BREAST XXLRG (GAUZE/BANDAGES/DRESSINGS) IMPLANT
BLADE SURG 15 STRL LF DISP TIS (BLADE) ×1 IMPLANT
BLADE SURG 15 STRL SS (BLADE) ×2
CANISTER SUC SOCK COL 7IN (MISCELLANEOUS) ×3 IMPLANT
CANISTER SUCT 1200ML W/VALVE (MISCELLANEOUS) IMPLANT
CHLORAPREP W/TINT 26ML (MISCELLANEOUS) ×3 IMPLANT
CLIP APPLIE 9.375 MED OPEN (MISCELLANEOUS) IMPLANT
CLIP TI WIDE RED SMALL 6 (CLIP) IMPLANT
COVER BACK TABLE 60X90IN (DRAPES) ×3 IMPLANT
COVER MAYO STAND STRL (DRAPES) ×3 IMPLANT
COVER PROBE W GEL 5X96 (DRAPES) ×3 IMPLANT
DECANTER SPIKE VIAL GLASS SM (MISCELLANEOUS) IMPLANT
DEVICE DUBIN W/COMP PLATE 8390 (MISCELLANEOUS) ×3 IMPLANT
DRAPE LAPAROSCOPIC ABDOMINAL (DRAPES) IMPLANT
DRAPE LAPAROTOMY 100X72 PEDS (DRAPES) ×3 IMPLANT
DRAPE UTILITY XL STRL (DRAPES) ×3 IMPLANT
ELECT COATED BLADE 2.86 ST (ELECTRODE) ×3 IMPLANT
ELECT REM PT RETURN 9FT ADLT (ELECTROSURGICAL) ×3
ELECTRODE REM PT RTRN 9FT ADLT (ELECTROSURGICAL) ×1 IMPLANT
GLOVE BIOGEL PI IND STRL 8 (GLOVE) ×1 IMPLANT
GLOVE BIOGEL PI INDICATOR 8 (GLOVE) ×2
GLOVE ECLIPSE 8.0 STRL XLNG CF (GLOVE) ×3 IMPLANT
GOWN STRL REUS W/ TWL LRG LVL3 (GOWN DISPOSABLE) ×2 IMPLANT
GOWN STRL REUS W/TWL LRG LVL3 (GOWN DISPOSABLE) ×4
HEMOSTAT SNOW SURGICEL 2X4 (HEMOSTASIS) IMPLANT
KIT MARKER MARGIN INK (KITS) ×3 IMPLANT
LIQUID BAND (GAUZE/BANDAGES/DRESSINGS) ×3 IMPLANT
NEEDLE HYPO 25X1 1.5 SAFETY (NEEDLE) ×3 IMPLANT
NS IRRIG 1000ML POUR BTL (IV SOLUTION) ×3 IMPLANT
PACK BASIN DAY SURGERY FS (CUSTOM PROCEDURE TRAY) ×3 IMPLANT
PENCIL BUTTON HOLSTER BLD 10FT (ELECTRODE) ×3 IMPLANT
SLEEVE SCD COMPRESS KNEE MED (MISCELLANEOUS) ×3 IMPLANT
SPONGE LAP 4X18 X RAY DECT (DISPOSABLE) ×3 IMPLANT
SUT MNCRL AB 4-0 PS2 18 (SUTURE) ×3 IMPLANT
SUT SILK 2 0 SH (SUTURE) IMPLANT
SUT VICRYL 3-0 CR8 SH (SUTURE) ×3 IMPLANT
SYR CONTROL 10ML LL (SYRINGE) ×3 IMPLANT
TOWEL OR 17X24 6PK STRL BLUE (TOWEL DISPOSABLE) ×3 IMPLANT
TOWEL OR NON WOVEN STRL DISP B (DISPOSABLE) ×3 IMPLANT
TUBE CONNECTING 20'X1/4 (TUBING) ×1
TUBE CONNECTING 20X1/4 (TUBING) ×2 IMPLANT
YANKAUER SUCT BULB TIP NO VENT (SUCTIONS) ×3 IMPLANT

## 2014-12-07 NOTE — Discharge Instructions (Signed)
[place] Office Phone Number [phone]  BREAST BIOPSY/Lumpectomy: POST OP INSTRUCTIONS  Always review your discharge instruction sheet given to you by the facility where your surgery was performed.  IF YOU HAVE DISABILITY OR FAMILY LEAVE FORMS, YOU MUST BRING THEM TO THE OFFICE FOR PROCESSING.  DO NOT GIVE THEM TO YOUR DOCTOR.  1. A prescription for pain medication may be given to you upon discharge.  Take your pain medication as prescribed, if needed.  If narcotic pain medicine is not needed, then you may take acetaminophen (Tylenol) or ibuprofen (Advil) as needed. 2. Take your usually prescribed medications unless otherwise directed 3. If you need a refill on your pain medication, please contact your pharmacy.  They will contact our office to request authorization.  Prescriptions will not be filled after 5pm or on week-ends. 4. You should eat very light the first 24 hours after surgery, such as soup, crackers, pudding, etc.  Resume your normal diet the day after surgery. 5. Most patients will experience some swelling and bruising in the breast.  Ice packs and a good support bra will help.  Swelling and bruising can take several days to resolve.  6. It is common to experience some constipation if taking pain medication after surgery.  Increasing fluid intake and taking a stool softener will usually help or prevent this problem from occurring.  A mild laxative (Milk of Magnesia or Miralax) should be taken according to package directions if there are no bowel movements after 48 hours. 7. Unless discharge instructions indicate otherwise, you may remove your bandages 24-48 hours after surgery, and you may shower at that time.  You may have steri-strips (small skin tapes) in place directly over the incision.  These strips should be left on the skin for 7-10 days.  If your surgeon used skin glue on the incision, you may shower in 24 hours.  The glue will flake off over the next 2-3  weeks.  Any sutures or staples will be removed at the office during your follow-up visit. 8. ACTIVITIES:  You may resume regular daily activities (gradually increasing) beginning the next day.  Wearing a good support bra or sports bra minimizes pain and swelling.  You may have sexual intercourse when it is comfortable. a. You may drive when you no longer are taking prescription pain medication, you can comfortably wear a seatbelt, and you can safely maneuver your car and apply brakes. b. RETURN TO WORK:  ______________________________________________________________________________________ 9. You should see your doctor in the office for a follow-up appointment approximately two weeks after your surgery.  Your doctors nurse will typically make your follow-up appointment when she calls you with your pathology report.  Expect your pathology report 2-3 business days after your surgery.  You may call to check if you do not hear from [place] after three days.   _CALL YOUR DOCTOR: 1. Fever over 101.0 2. Nausea and/or vomiting. 3. Extreme swelling or bruising. 4. Continued bleeding from incision. 5. Increased pain, redness, or drainage from the incision.  The clinic staff is available to answer your questions during regular business hours.  Please dont hesitate to call and ask to speak to one of the nurses for clinical concerns.  If you have a medical emergency, go to the nearest emergency room or call 911.  A surgeon from [facility] is always on call at the hospital.  For further questions, please visit [email or web address]    Post Anesthesia Home Care Instructions  Activity: Get plenty  of rest for the remainder of the day. A responsible adult should stay with you for 24 hours following the procedure.  For the next 24 hours, DO NOT: -Drive a car -Paediatric nurse -Drink alcoholic beverages -Take any medication unless instructed by your physician -Make any legal decisions or  sign important papers.  Meals: Start with liquid foods such as gelatin or soup. Progress to regular foods as tolerated. Avoid greasy, spicy, heavy foods. If nausea and/or vomiting occur, drink only clear liquids until the nausea and/or vomiting subsides. Call your physician if vomiting continues.  Special Instructions/Symptoms: Your throat may feel dry or sore from the anesthesia or the breathing tube placed in your throat during surgery. If this causes discomfort, gargle with warm salt water. The discomfort should disappear within 24 hours.  If you had a scopolamine patch placed behind your ear for the management of post- operative nausea and/or vomiting:  1. The medication in the patch is effective for 72 hours, after which it should be removed.  Wrap patch in a tissue and discard in the trash. Wash hands thoroughly with soap and water. 2. You may remove the patch earlier than 72 hours if you experience unpleasant side effects which may include dry mouth, dizziness or visual disturbances. 3. Avoid touching the patch. Wash your hands with soap and water after contact with the patch.

## 2014-12-07 NOTE — Interval H&P Note (Signed)
History and Physical Interval Note:  12/07/2014 10:14 AM  Christy Porter  has presented today for surgery, with the diagnosis of Left Nipple Discharge  The various methods of treatment have been discussed with the patient and family. After consideration of risks, benefits and other options for treatment, the patient has consented to  Procedure(s): LEFT BREAST LUMPECTOMY WITH RADIOACTIVE SEED LOCALIZATION AND LEFT CENTRAL DUCT EXCISION (Left) as a surgical intervention .  The patient's history has been reviewed, patient examined, no change in status, stable for surgery.  I have reviewed the patient's chart and labs.  Questions were answered to the patient's satisfaction.     CORNETT,THOMAS A.

## 2014-12-07 NOTE — Anesthesia Preprocedure Evaluation (Addendum)
Anesthesia Evaluation  Patient identified by MRN, date of birth, ID band Patient awake    Reviewed: Allergy & Precautions, H&P , NPO status , Patient's Chart, lab work & pertinent test results  History of Anesthesia Complications (+) PONV  Airway Mallampati: I  TM Distance: >3 FB Neck ROM: Full    Dental no notable dental hx. (+) Teeth Intact, Dental Advisory Given   Pulmonary neg pulmonary ROS,    Pulmonary exam normal breath sounds clear to auscultation       Cardiovascular hypertension, Pt. on medications  Rhythm:Regular Rate:Normal     Neuro/Psych  Headaches, negative psych ROS   GI/Hepatic negative GI ROS, Neg liver ROS,   Endo/Other  negative endocrine ROS  Renal/GU negative Renal ROS  negative genitourinary   Musculoskeletal   Abdominal   Peds  Hematology negative hematology ROS (+)   Anesthesia Other Findings   Reproductive/Obstetrics negative OB ROS                           Anesthesia Physical Anesthesia Plan  ASA: II  Anesthesia Plan: General   Post-op Pain Management:    Induction: Intravenous  Airway Management Planned: LMA  Additional Equipment:   Intra-op Plan:   Post-operative Plan: Extubation in OR  Informed Consent: I have reviewed the patients History and Physical, chart, labs and discussed the procedure including the risks, benefits and alternatives for the proposed anesthesia with the patient or authorized representative who has indicated his/her understanding and acceptance.   Dental advisory given  Plan Discussed with: CRNA  Anesthesia Plan Comments:         Anesthesia Quick Evaluation

## 2014-12-07 NOTE — Anesthesia Postprocedure Evaluation (Signed)
  Anesthesia Post-op Note  Patient: Christy Porter  Procedure(s) Performed: Procedure(s): LEFT BREAST LUMPECTOMY WITH RADIOACTIVE SEED LOCALIZATION AND LEFT CENTRAL DUCT EXCISION (Left)  Patient Location: PACU  Anesthesia Type:General  Level of Consciousness: awake and alert   Airway and Oxygen Therapy: Patient Spontanous Breathing  Post-op Pain: Controlled  Post-op Assessment: Post-op Vital signs reviewed, Patient's Cardiovascular Status Stable and Respiratory Function Stable  Post-op Vital Signs: Reviewed  Filed Vitals:   12/07/14 1215  BP:   Pulse: 57  Temp:   Resp: 12    Complications: No apparent anesthesia complications

## 2014-12-07 NOTE — H&P (View-Only) (Signed)
Selicia R. Wist 11/12/2014 11:39 AM Location: [facility] Patient #: [id number] DOB: 06/25/1970 Married / Language: English / Race: White Female History of Present Illness (Kymari Lollis A. Amulya Quintin MD; 11/12/2014 12:16 PM) Patient words: new-nipple discharge   Pt sent at the request of Dr Susan Turner for bloody left nipple discahrge that started december 2105. This was biopsied and showed fibrocystic disease. It stopped and restarted this summer. Ductogram showed a small filling defect or debris 4 oclock left nipple subareolar and clip separated from this. Pt has history of bilateral breast reduction in 2000. The area gets sore as well. Discharge has been bloody and has stained garments. Intermittent at times.       Breast, left, needle core biopsy, 4 o'clock - FIBROCYSTIC CHANGES. - THERE IS NO EVIDENCE OF MALIGNANCY. - SEE COMMENT.        CLINICAL DATA: 44-year-old patient recently presented for evaluation of bloody left nipple discharge which first occurred in December of 2015. She has had a history in the past of milky bilateral nipple discharge. History of bilateral breast reduction in her 20s. On 10/03/2014, ultrasound of the breasts was performed. In the 4 o'clock position of the left breast a dilated duct containing debris versus less likely intraductal mass was imaged. It was decided to perform biopsy given the history of bloody discharge. Pathology revealed fibrocystic changes. The patient states that approximately 2 weeks after the biopsy, she again had brownish discharge at home, and was evaluated by her physician and again bloody discharge was seen. She presents for repeat left breast ultrasound and possible ductogram. EXAM: ULTRASOUND OF THE LEFT BREAST COMPARISON: Previous exam(s). FINDINGS: On physical exam, the patient is able to elicit bloody nipple discharge from a single duct orifice of the left nipple with manual expression today. Targeted  ultrasound is performed. A rounded soft tissue filling defect is seen immediately subjacent to the nipple at 4 o'clock position subareolar. This intraductal mass measures 4 x 2 x 2 mm. On color Doppler imaging, I do not see vascular flow within this intraductal mass. Slightly further from the nipple in the 4 o'clock retroareolar left breast 2 cm from the nipple, 2 echogenic lines are seen within a dilated duct, consistent with the biopsy clip placed on 10/04/2014 at the time of biopsy. No discrete intraductal soft tissue or nodule is seen in the region of recent biopsy. IMPRESSION: 4 x 2 x 2 mm intraductal soft tissue nodule versus debris is seen in the immediate retroareolar left breast, 4 o'clock position. This is more proximal to the nipple than the recently biopsied intraductal mass versus debris on 10/03/2014, with pathology results showing benign fibrocystic changes. Definite bloody nipple discharge is visualized on exam today. Attempt will be made at a ductogram. RECOMMENDATION: Ductogram will be attempted today and dictated separately. Surgical consultation is also recommended, given the persistent bloody nipple discharge and intraductal mass, for which papilloma and malignancy cannot be excluded. The patient is scheduled Dr. Americo Vallery at on 11/12/2014. We also discussed the possibility of a second ultrasound-guided biopsy of the retroareolar intraductal mass versus debris seen on today's exam. The patient and I planned that she will have surgical consultation first, and if biopsy is felt to be helpful for surgical planning, ultrasound-guided biopsy could be ordered. I have discussed the findings and recommendations with the patient. Results were also provided in writing at the conclusion of the visit. If applicable, a reminder letter will be sent to the patient regarding the next appointment. BI-RADS   CATEGORY 4: Suspicious abnormality - biopsy should be considered. Electronically Signed By:  Susan Turner M.D. On: 10/25/2014 16:30     Vitals Height Weight BMI (Calculated)   Interpretation Summary CLINICAL DATA: 44-year-old patient recently presented for evaluation of bloody left nipple discharge which first occurred in December of 2015. She has had a history in the past of milky bilateral nipple discharge. History of bilateral breast reduction in her 20s. On 10/03/2014, ultrasound of the breasts was performed. In the 4 o'clock position of the left breast a dilated duct containing debris versus less likely intraductal mass was imaged. It was decided to perform biopsy given the history of bloody discharge. Pathology revealed fibrocystic changes. The patient states that approximately 2 weeks after the biopsy, she again had brownish discharge at home, and was evaluated by her physician and again bloody discharge was seen. She presents for repeat left breast ultrasound and possible ductogram. EXAM: ULTRASOUND OF THE LEFT BREAST COMPARISON: Previous exam(s). FINDINGS: On physical exam, the patient is able to elicit bloody nipple discharge from a single duct orifice of the left nipple with manual expression today. Targeted ultrasound is performed. A rounded soft tissue filling defect is seen immediately subjacent to the nipple at 4 o'clock position subareolar. This intraductal mass measures 4 x 2 x 2 mm. On color Doppler imaging, I do not see vascular flow within this intraductal mass. Slightly further from the nipple in the 4 o'clock retroareolar left breast 2 cm from the nipple, 2 echogenic lines are seen within a dilated duct, consistent with the biopsy clip placed on 10/04/2014 at the time of biopsy. No discrete intraductal soft tissue or nodule is seen in the region of recent biopsy. IMPRESSION: 4 x 2 x 2 mm intraductal soft tissue nodule versus debris is seen in the immediate retroareolar left breast, 4 o'clock position. This is more proximal to the nipple than the recently  biopsied intraductal mass versus debris on 10/03/2014, with pathology results showing benign fibrocystic changes. Definite bloody nipple discharge is visualized on exam today. Attempt will be made at a ductogram. RECOMMENDATION: Ductogram will be attempted today and dictated separately. Surgical consultation is also recommended, given the persistent bloody nipple discharge and intraductal mass, for which papilloma and malignancy cannot be excluded. The patient is scheduled Dr. Amisadai Woodford at on 11/12/2014. We also discussed the possibility of a second ultrasound-guided biopsy of the retroareolar intraductal mass versus debris seen on today's exam. The patient and I planned that she will have surgical consultation first, and if biopsy is felt to be helpful for surgical planning, ultrasound-guided biopsy could be ordered. I have discussed the findings and recommendations with the patient. Results were also provided in writing at the conclusion of the visit. If applicable, a reminder letter will be sent to the patient regarding the next appointment. BI-RADS CATEGORY 4: Suspicious abnormality - biopsy should be considered. Electronically Signed By: Susan Turner M.D. On: 10/25/2014 16:30.  The patient is a 44 year old female   Allergies (Chemira Jones; 11/12/2014 11:40 AM) No Known Drug Allergies 11/12/2014  Medication History (Chemira Jones; 11/12/2014 11:41 AM) Linzess (290MCG Capsule, Oral) Active. Diazepam (5MG Tablet, Oral) Active. Potassium Bicarbonate (20MEQ Tablet Effer, Oral) Active. Topamax (25MG Tablet, Oral) Active. Medications Reconciled    Vitals (Chemira Jones; 11/12/2014 11:40 AM) 11/12/2014 11:40 AM Weight: 138.2 lb Height: 69in Body Surface Area: 1.75 m Body Mass Index: 20.41 kg/m Temp.: 98.2F(Oral)  Pulse: 66 (Regular)  BP: 112/76 (Sitting, Left Arm, Standard)       Physical Exam (Graelyn Bihl A. Calise Dunckel MD; 11/12/2014 12:18 PM)  General Mental  Status-Alert. General Appearance-Consistent with stated age. Hydration-Well hydrated. Voice-Normal.  Head and Neck Head-normocephalic, atraumatic with no lesions or palpable masses. Trachea-midline. Thyroid Gland Characteristics - normal size and consistency.  Eye Eyeball - Bilateral-Extraocular movements intact. Sclera/Conjunctiva - Bilateral-No scleral icterus.  Chest and Lung Exam Inspection Chest Wall - Normal. Shape - Normal. Auscultation Vocal resonance - Normal.  Breast Note: scars from reduction noted bilaterally left nupple discharge cloudy to clear non bloody. no mases right breast without mass no discharge. reductio scars noted.   Neurologic Neurologic evaluation reveals -alert and oriented x 3 with no impairment of recent or remote memory. Mental Status-Normal.  Musculoskeletal Normal Exam - Left-Upper Extremity Strength Normal and Lower Extremity Strength Normal. Normal Exam - Right-Upper Extremity Strength Normal and Lower Extremity Strength Normal.  Lymphatic Axillary  General Axillary Region: Bilateral - Description - Normal. Tenderness - Non Tender.    Assessment & Plan (Steaven Wholey A. Sylvester Salonga MD; 11/12/2014 12:20 PM)  NIPPLE DISCHARGE, BLOODY (611.79  N64.52) Impression: RECOMMEND LEFT BREAST CENTRAL DUCT EXCISON with seed localization Risk of lumpectomy include bleeding, infection, seroma, nipple loss, more surgery, use of seed/wire, wound care, cosmetic deformity and the need for other treatments, death , blood clots, death. Discussed possible nipple loss and cosmetic issues as well. Pt agrees to proceed.  Current Plans Pt Education - [facility] Free Text Education/Instructions: discussed with patient and provided information. Pt Education - CSS Breast Biopsy Instructions (FLB): discussed with patient and provided information.   The anatomy and the physiology was discussed. The pathophysiology and natural history of the disease was  discussed. Options were discussed and recommendations were made. Technique, risks, benefits, & alternatives were discussed. Risks such as stroke, heart attack, bleeding, indection, death, and other risks discussed. Questions answered. The patient agrees to proceed. Pt Education - Surgical management of pathologic nipple discharge: discussed with patient and provided information. Pt Education - Nipple discharge: discussed with patient and provided information. LEFT BREAST MASS (611.72  N63) 

## 2014-12-07 NOTE — Op Note (Signed)
Preoperative diagnosis: Left breast nipple discharge and mass  Postoperative diagnosis: Same   Procedure: Left breast seed localized lumpectomy  Surgeon: Erroll Luna M.D.  Anesthesia: Gen. With 0.25% Sensorcaine local  EBL: 20 cc  Specimen: Left breast tissue with clip and radioactive seed in the specimen. Verified with neoprobe and radiographic image showing both seed and clip in specimen  Indications for procedure: The patient presents for left breast excisional lumpectomy after core biopsy showed an intraductal mass and a second area that was core biopsied and showed fibrocystic disease.  Concern was for a papilloma give the history of bloody nipple discharge.  . Discussed the rationale for considering excision. Small risk of malignancy associated with papilloma lesion after core biopsy. Discussed observation. Discussed wire and seed  localization. Patient desired excision of left breast papilloma/ mass.The procedure has been discussed with the patient. Alternatives to surgery have been discussed with the patient.  Risks of surgery include bleeding,  Infection, nipple loss,   Seroma formation, death,  and the need for further surgery.   The patient understands and wishes to proceed.   Description of procedure: Patient underwent seed placement as an outpatient. Patient presents today for left breast seed localized lumpectomy. Patient and holding area. Questions are answered and neoprobe used to verify seed location. Patient taken back to the operating room and placed upon the OR table. After induction of general anesthesia, left breast prepped and draped in a sterile fashion. Timeout was done to verify proper sizing procedure. Neoprobe used and hot spot identified and left breast upper-outer area just below the edge of the NAC.   A PROBE WAS USED TO TRY TO IDENTIFY THE DRAINING DUCT BUT THIS WAS UNSUCCESSFUL. This was marked with pen. Curvilinear incision made left upper outer NAC.   Dissection  used with the help of a neoprobe around the tissue where the seed and clip were located. Tissue removed in its entirety with gross margins.The offending duct was identified under the nipple and was excised with the seed.  She has very dense fibrous tissue . Neoprobe used and seed/ clip within specimen. Radiographs taken which show clip and seed  In specimen.hemostasis achieved and cavity closed with 3-0 Vicryl and 4-0 Monocryl. Dermabond applied. All final counts found to be correct. Specimen transported to pathology. Nipple viable at this point.  Patient awoke extubated taken to recovery in satisfactory condition.

## 2014-12-07 NOTE — Anesthesia Procedure Notes (Signed)
Procedure Name: LMA Insertion Date/Time: 12/07/2014 11:08 AM Performed by: Toula Moos L Pre-anesthesia Checklist: Patient identified, Emergency Drugs available, Suction available, Patient being monitored and Timeout performed Patient Re-evaluated:Patient Re-evaluated prior to inductionOxygen Delivery Method: Circle System Utilized Preoxygenation: Pre-oxygenation with 100% oxygen Intubation Type: IV induction Ventilation: Mask ventilation without difficulty LMA: LMA inserted LMA Size: 4.0 Number of attempts: 1 Airway Equipment and Method: Bite block Placement Confirmation: positive ETCO2 Tube secured with: Tape Dental Injury: Teeth and Oropharynx as per pre-operative assessment

## 2014-12-07 NOTE — Transfer of Care (Signed)
Immediate Anesthesia Transfer of Care Note  Patient: Christy Porter  Procedure(s) Performed: Procedure(s): LEFT BREAST LUMPECTOMY WITH RADIOACTIVE SEED LOCALIZATION AND LEFT CENTRAL DUCT EXCISION (Left)  Patient Location: PACU  Anesthesia Type:General  Level of Consciousness: sedated  Airway & Oxygen Therapy: Patient Spontanous Breathing and Patient connected to face mask oxygen  Post-op Assessment: Report given to RN and Post -op Vital signs reviewed and stable  Post vital signs: Reviewed and stable  Last Vitals:  Filed Vitals:   12/07/14 1021  BP: 126/78  Pulse: 57  Temp: 36.9 C  Resp: 18    Complications: No apparent anesthesia complications

## 2014-12-10 ENCOUNTER — Encounter (HOSPITAL_BASED_OUTPATIENT_CLINIC_OR_DEPARTMENT_OTHER): Payer: Self-pay | Admitting: Surgery

## 2014-12-24 ENCOUNTER — Other Ambulatory Visit: Payer: Self-pay | Admitting: Obstetrics and Gynecology

## 2014-12-25 LAB — CYTOLOGY - PAP

## 2015-04-10 ENCOUNTER — Other Ambulatory Visit: Payer: Self-pay | Admitting: Gynecology

## 2015-04-10 ENCOUNTER — Other Ambulatory Visit: Payer: Self-pay | Admitting: Obstetrics and Gynecology

## 2015-04-10 DIAGNOSIS — N644 Mastodynia: Secondary | ICD-10-CM

## 2015-04-10 DIAGNOSIS — N6459 Other signs and symptoms in breast: Secondary | ICD-10-CM

## 2015-04-12 ENCOUNTER — Other Ambulatory Visit: Payer: Managed Care, Other (non HMO)

## 2015-04-15 ENCOUNTER — Ambulatory Visit
Admission: RE | Admit: 2015-04-15 | Discharge: 2015-04-15 | Disposition: A | Discharge status: Home / Self Care | Payer: Managed Care, Other (non HMO) | Source: Ambulatory Visit | Attending: Obstetrics and Gynecology | Admitting: Obstetrics and Gynecology

## 2015-04-15 DIAGNOSIS — N644 Mastodynia: Secondary | ICD-10-CM

## 2015-04-15 DIAGNOSIS — N6459 Other signs and symptoms in breast: Secondary | ICD-10-CM

## 2015-04-25 ENCOUNTER — Telehealth: Payer: Self-pay | Admitting: Neurology

## 2015-04-25 NOTE — Telephone Encounter (Signed)
She will need a f/u appointment to be evaluated, last seen by me in December 2014.    Rowen Hur K. Posey Pronto, DO

## 2015-04-25 NOTE — Telephone Encounter (Signed)
Patient coming in Friday, February 3 at 3:00.

## 2015-04-25 NOTE — Telephone Encounter (Signed)
What would you like for me to call in for this patient?

## 2015-04-25 NOTE — Telephone Encounter (Signed)
PT called and said she is having headaches and would like a prescription called in/Dawn  CB# [phone]

## 2015-04-26 ENCOUNTER — Encounter: Payer: Self-pay | Admitting: Neurology

## 2015-04-26 ENCOUNTER — Ambulatory Visit (INDEPENDENT_AMBULATORY_CARE_PROVIDER_SITE_OTHER): Payer: Managed Care, Other (non HMO) | Admitting: Neurology

## 2015-04-26 VITALS — BP 110/70 | HR 76 | Wt 141.1 lb

## 2015-04-26 DIAGNOSIS — R51 Headache: Secondary | ICD-10-CM

## 2015-04-26 DIAGNOSIS — R519 Headache, unspecified: Secondary | ICD-10-CM

## 2015-04-26 MED ORDER — TOPIRAMATE ER 25 MG PO CAP24: 25.0000 mg | ORAL_CAPSULE | Freq: Every day | ORAL | Status: DC

## 2015-04-26 NOTE — Patient Instructions (Addendum)
Great to see you!  Start Trokendi 25mg  daily Limit ibuprofen to twice per week  Call with update in 3 months, or sooner as needed

## 2015-04-26 NOTE — Addendum Note (Signed)
Addended by: Chester Holstein on: 04/26/2015 03:51 PM   Modules accepted: Orders

## 2015-04-26 NOTE — Progress Notes (Signed)
[facility]  Follow-up Visit   Date: 04/26/2015    Christy Porter MRN: [id number] DOB: 04/06/1970   Interim History: Christy Porter is a 45 y.o. year-old left-handed Caucasian female with history of HELLP syndrome and vitamin D deficiency returning to the clinic for migraines.     History of present illness: She initally presented to the office on 12/13/2012 with left leg paresthesias.  Her symptoms started fairly abruptly ~66-month following the delivery of her second child (October 2013) when she woke up one morning and noticed that the left lateral thigh did not feel like it usually would. She describes the discomfort as a burning sensation localized to the mid-lateral thigh to right above the knee. She has associated pain which is deep and achy and occasional tingling over the antero-lateral thigh. Symptoms are better in flat shoes and worse with heels. She saw orthopeadic surgery initially because of concern of irritation of the IT band and was given a two week trial of steroids, but there was no relief.    Her pregnancy was complicated by HELLP syndrome and varicose veins. She underwent surgery for the large varicose vein over the left medial thigh in January 2013 and for the smaller veins over the left lateral thigh had injections placed. She does not know what was injected, but does report that her symptoms seem to be over the same area, but started several months following the procedure.[facility]   She also reports having a number of intermittent symptoms since the delivery of her second child such as numbness/tingling of her hands and left side of the face, hearing and vision changes. She has been evaluated by ENT, rheumatology, and opthalmology who did not find any abnormalities. She also saw physical therapy because of the sensation of left leg weakness and tightness and was told that her  left leg appeared to have muscle atrophy.   01/12/2013 Follow-up of left leg symptoms:   left leg discomfort has improved by 75% and it is no longer constant.  She tried neurontin 2035mat bedtime which seemed to have helped, but made her sleepy.  02/28/2013 Follow-up of migraine:  History of headaches started in her teens, which improved after stopping birth control (3031s  Since then, she gets unilateral headaches located over the left temporal and parietal region which responds well to NSAIDs and Fioricet.  Headaches are not associated with photophobia, phonophobia, nausea, or vomiting. Around November 2014, she developed tingling, "warm water sensation" over the left side of her head (pariental region, temple, inner ear).   She would have intermittent brief sharp pain over the same area and including her left eye.  She tried ibuprofen and fioricet which did not help.    She saw Dr. WiJannifer Franklinn November 2014 for headaches at which time prednisone dosepak was started, but this did not help. Symptoms have become more constant lately with sharp pain over the left temple region.  Topamax 2581maily was started, but she has not noticed any change. She also started developing paresthesias over the left side of her face.   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  UPDATE 04/26/2015:  She was last seen in the office in December 2014 and return today with complaints of headaches. Starting in October, she began having spells of left  retroorbital pain with associated tingling over the cheek on the left. She endorses scintillating scotomas, no nausea/vomiting.  She has been using ibuprofen for the past 3 weeks daily, without any benefit.  She was previously taking topiramate 71m daily which controlled her headaches, but only took this for 6 weeks.    Medications:  Current Outpatient Prescriptions on  File Prior to Visit  Medication Sig Dispense Refill  . hydrochlorothiazide 25 MG tablet Take 25 mg by mouth daily.      .Marland KitchenHYDROcodone-acetaminophen (NORCO) 5-325 MG per tablet Take 1 tablet by mouth every 6 (six) hours as needed for moderate pain. 30 tablet 0  . Linaclotide (LINZESS) 290 MCG CAPS capsule Take 290 mcg by mouth once a week.     No current facility-administered medications on file prior to visit.    Allergies: No Known Allergies   Review of Systems:  CONSTITUTIONAL: No fevers, chills, night sweats, or weight loss.   EYES: No visual changes or eye pain ENT: No hearing changes.  No history of nose bleeds.   RESPIRATORY: No cough, wheezing and shortness of breath.   CARDIOVASCULAR: Negative for chest pain, and palpitations.   GI: Negative for abdominal discomfort, blood in stools or black stools.  No recent change in bowel habits.   GU:  No history of incontinence.   MUSCLOSKELETAL: No history of joint pain or swelling.  No myalgias.   SKIN: Negative for lesions, rash, and itching.   HEMATOLOGY/ONCOLOGY: Negative for prolonged bleeding, bruising easily, and swollen nodes.  ENDOCRINE: Negative for cold or heat intolerance, polydipsia or goiter.   PSYCH:  No depression or anxiety symptoms.   NEURO: As Above.   Vital Signs:  BP 110/70 mmHg  Pulse 76  Wt 141 lb 1 oz (63.986 kg)  SpO2 96% Pain Scale: 0 on a scale of 0-10    Neurological Exam: MENTAL STATUS including orientation to time, place, person, recent and remote memory, attention span and concentration, language, and fund of knowledge is normal.  Speech is not dysarthric.  CRANIAL NERVES: II:  No visual field defects.  Unremarkable fundi. III-IV-VI: Pupils equal round and reactive to light.  Normal conjugate, extra-ocular eye movements in all directions of gaze.  No nystagmus.  No ptosis.   V:  Normal facial sensation.  VII:  Normal facial symmetry and movements.   VIII:  Normal hearing and vestibular  function.   IX-X:  Normal palatal movement.   XI:  Normal shoulder shrug and head rotation.  Cervical muscles are supple without increased tension. XII:  Normal tongue strength and range of motion, no deviation or fasciculation.  MOTOR:  Muscle strength is 5/5 in all extremities.  No atrophy, fasciculations or abnormal movements.  No pronator drift.  Tone is normal.    MSRs:  Reflexes are 2+/4 throughout.  SENSORY:  Intact to light touch and vibration throughout  COORDINATION/GAIT: Normal finger-to- nose-finger. Gait narrow based and stable.   Data: MRI lumbar spine 02/26/2012: No cord abnormalities, no focal stenosis or herniation.  06/02/2012 labs: CRP (1.7), CK 76, ESR 4, TSH 1.770  07/14/2012 labs: Vitamin B12 262   CT head wo contrast 10/22/2011: Normal CT of the head without contrast. MRI brain 03/02/2013:   1.  Minimal nonspecific hemispheric white matter changes.  2.  Changes of chronic or allergic sinusitis, predominant to the right maxillary sinus. 3.  No acute intracranial change   IMPRESSION: 1.  Chronic migraine   - Side-locked retroorbital pain with facial paresthesias,  exam is non-focal  - Previously tried topiramate which resolved headaches, but developed cognitive side effects  - Start Trokendi 25m daily  2.  Atypical left leg paresthesias, improving  Call with update in 333-month The duration of this appointment visit was 30 minutes of face-to-face time with the patient.  Greater than 50% of this time was spent in counseling, explanation of diagnosis, planning of further management, and coordination of care.   Thank you for allowing me to participate in patient's care.  If I can answer any additional questions, I would be pleased to do so.    Sincerely,    Nannette Zill K. PaPosey ProntoDO

## 2015-08-02 ENCOUNTER — Other Ambulatory Visit: Payer: Self-pay | Admitting: Internal Medicine

## 2015-08-02 DIAGNOSIS — N644 Mastodynia: Secondary | ICD-10-CM

## 2015-08-27 ENCOUNTER — Other Ambulatory Visit: Payer: Self-pay | Admitting: Internal Medicine

## 2015-08-27 DIAGNOSIS — N644 Mastodynia: Secondary | ICD-10-CM

## 2015-09-02 ENCOUNTER — Ambulatory Visit
Admission: RE | Admit: 2015-09-02 | Discharge: 2015-09-02 | Disposition: A | Discharge status: Home / Self Care | Payer: Managed Care, Other (non HMO) | Source: Ambulatory Visit | Attending: Internal Medicine | Admitting: Internal Medicine

## 2015-09-02 DIAGNOSIS — N644 Mastodynia: Secondary | ICD-10-CM

## 2015-09-13 ENCOUNTER — Ambulatory Visit (INDEPENDENT_AMBULATORY_CARE_PROVIDER_SITE_OTHER): Payer: Managed Care, Other (non HMO) | Admitting: Internal Medicine

## 2015-09-13 ENCOUNTER — Telehealth: Payer: Self-pay | Admitting: Internal Medicine

## 2015-09-13 ENCOUNTER — Telehealth: Payer: Self-pay

## 2015-09-13 VITALS — BP 116/70 | HR 70 | Ht 69.0 in | Wt 137.1 lb

## 2015-09-13 DIAGNOSIS — R002 Palpitations: Secondary | ICD-10-CM | POA: Diagnosis not present

## 2015-09-13 DIAGNOSIS — R079 Chest pain, unspecified: Secondary | ICD-10-CM | POA: Diagnosis not present

## 2015-09-13 NOTE — Patient Instructions (Addendum)
Medication Instructions:  Your physician recommends that you continue on your current medications as directed. Please refer to the Current Medication list given to you today.  Labwork: Bmet/magnesium next week when you return for you tets  Testing/Procedures: Your physician has requested that you have an echocardiogram. Echocardiography is a painless test that uses sound waves to create images of your heart. It provides your doctor with information about the size and shape of your heart and how well your heart's chambers and valves are working. This procedure takes approximately one hour. There are no restrictions for this procedure.  Your physician has recommended that you wear an event monitor. Event monitors are medical devices that record the heart's electrical activity. Doctors most often [place] these monitors to diagnose arrhythmias. Arrhythmias are problems with the speed or rhythm of the heartbeat. The monitor is a small, portable device. You can wear one while you do your normal daily activities. This is usually used to diagnose what is causing palpitations/syncope (passing out).  Follow-Up: Will be determined after reviewing the above tests   If you need a refill on your cardiac medications before your next appointment, please call your pharmacy.

## 2015-09-13 NOTE — Telephone Encounter (Signed)
I received a phone call from Dr Coralie Keens in regards to the pt having atypical chest pain and arrhythmia with exercise.  He would like the pt to see Dr Harrington Challenger for further evaluation of symptoms.  I spoke with Dr Harrington Challenger and she can see the pt today at 4:30 or the pt will need to be seen in the [place] office.  I left a message for the pt to contact the office to arrange appointment.

## 2015-09-13 NOTE — Telephone Encounter (Signed)
Appointment scheduled.

## 2015-09-13 NOTE — Progress Notes (Signed)
Cardiology Office Note   Date:  09/14/2015   ID:  Christy Porter, DOB 12-17-1970, MRN [id number]  PCP:  Precious Reel, MD  Cardiologist:   Dorris Carnes, MD   Pt referred by Arlan Organ for eval of CP and palpitations    History of Present Illness: Christy Porter is a 45 y.o. female with a history of chest pains  She says that in Jan she began experiencing L sided CP  (low L parasternal) that goes to back  Also to shoulder  Comes and goes  Not associated with acitvity  No SOB  Stabbing at times  Nothing seems to help or make worse  The other day she was on the treadmill and she felt her heart fltter in her chest like fish flopping  Lasted about 30 sec  No dizziness  Got off of treadmill  Did not feel right for rest of day   Hs has palptiations in  The past  Was seen by Einar Crow years ago  Not like this spell         Outpatient Prescriptions Prior to Visit  Medication Sig Dispense Refill  . hydrochlorothiazide 25 MG tablet Take 25 mg by mouth daily.      Marland Kitchen HYDROcodone-acetaminophen (NORCO) 5-325 MG per tablet Take 1 tablet by mouth every 6 (six) hours as needed for moderate pain. 30 tablet 0  . Linaclotide (LINZESS) 290 MCG CAPS capsule Take 290 mcg by mouth once a week.    . Topiramate ER (TROKENDI XR) 25 MG CP24 Take 25 mg by mouth daily. 30 capsule 5  . Topiramate ER 25 MG CP24 Take 25 mg by mouth daily. 14 capsule 0   No facility-administered medications prior to visit.     Allergies:   Review of patient's allergies indicates no known allergies.   Past Medical History  Diagnosis Date  . Hypertension   . Vitamin D deficiency   . HELLP syndrome   . Migraine without aura, without mention of intractable migraine without mention of status migrainosus 02/13/2013  . PONV (postoperative nausea and vomiting)     Past Surgical History  Procedure Laterality Date  . Cesarean section  '08  . Breast surgery    . Breast lumpectomy with radioactive seed localization Left 12/07/2014      Procedure: LEFT BREAST LUMPECTOMY WITH RADIOACTIVE SEED LOCALIZATION AND LEFT CENTRAL DUCT EXCISION;  Surgeon: Erroll Luna, MD;  Location: [facility];  Service: General;  Laterality: Left;     Social History:  The patient  reports that she has never smoked. She has never used smokeless tobacco. She reports that she drinks alcohol. She reports that she does not use illicit drugs.   Family History:  The patient's family history includes Healthy in her father; Hypertension in her mother; Lupus in her maternal grandmother; Migraines in her mother.    ROS:  Please see the history of present illness. All other systems are reviewed and  Negative to the above problem except as noted.    PHYSICAL EXAM: VS:  BP 116/70 mmHg  Pulse 70  Ht 5\' 9"  (1.753 m)  Wt 137 lb 1.9 oz (62.197 kg)  BMI 20.24 kg/m2  GEN: Well nourished, well developed, in no acute distress HEENT: normal Neck: no JVD, carotid bruits, or masses Cardiac: RRR; no murmurs, rubs, or gallops,no edema  Respiratory:  clear to auscultation bilaterally, normal work of breathing GI: soft, nontender, nondistended, + BS  No hepatomegaly  MS: no  deformity Moving all extremities   Skin: warm and dry, no rash Neuro:  Strength and sensation are intact Psych: euthymic mood, full affect   EKG:  EKG is ordered today.   Lipid Panel No results found for: CHOL, TRIG, HDL, CHOLHDL, VLDL, LDLCALC, LDLDIRECT    Wt Readings from Last 3 Encounters:  09/13/15 137 lb 1.9 oz (62.197 kg)  04/26/15 141 lb 1 oz (63.986 kg)  12/07/14 137 lb 6 oz (62.313 kg)      ASSESSMENT AND PLAN:  1  Chest pain  Atypical for ischemia  Will set up for echo    2. Palpitations  Concerning for arrhythmia  I would reicmm an event montior to evaluate  Signed, Dorris Carnes, MD  09/14/2015 11:27 PM    [address], [place], [place]  [address] Phone: [phone]; Fax: [phone]

## 2015-09-20 ENCOUNTER — Ambulatory Visit (INDEPENDENT_AMBULATORY_CARE_PROVIDER_SITE_OTHER): Payer: Managed Care, Other (non HMO)

## 2015-09-20 DIAGNOSIS — R002 Palpitations: Secondary | ICD-10-CM

## 2015-09-20 DIAGNOSIS — R079 Chest pain, unspecified: Secondary | ICD-10-CM | POA: Diagnosis not present

## 2015-09-23 ENCOUNTER — Other Ambulatory Visit: Payer: Self-pay | Admitting: Internal Medicine

## 2015-09-30 ENCOUNTER — Encounter: Payer: Self-pay | Admitting: Cardiology

## 2015-10-01 NOTE — Telephone Encounter (Signed)
Erroneous encounter

## 2015-10-04 ENCOUNTER — Other Ambulatory Visit: Payer: Self-pay

## 2015-10-04 ENCOUNTER — Ambulatory Visit (HOSPITAL_COMMUNITY): Payer: Managed Care, Other (non HMO) | Attending: Internal Medicine

## 2015-10-04 DIAGNOSIS — R079 Chest pain, unspecified: Secondary | ICD-10-CM | POA: Insufficient documentation

## 2015-10-04 DIAGNOSIS — I10 Essential (primary) hypertension: Secondary | ICD-10-CM | POA: Insufficient documentation

## 2015-10-04 DIAGNOSIS — I071 Rheumatic tricuspid insufficiency: Secondary | ICD-10-CM | POA: Diagnosis not present

## 2015-10-04 DIAGNOSIS — R002 Palpitations: Secondary | ICD-10-CM | POA: Diagnosis not present

## 2015-10-07 ENCOUNTER — Telehealth: Payer: Self-pay | Admitting: *Deleted

## 2015-10-07 ENCOUNTER — Other Ambulatory Visit: Payer: Self-pay | Admitting: *Deleted

## 2015-10-07 ENCOUNTER — Other Ambulatory Visit (INDEPENDENT_AMBULATORY_CARE_PROVIDER_SITE_OTHER): Payer: Managed Care, Other (non HMO) | Admitting: *Deleted

## 2015-10-07 DIAGNOSIS — I272 Other secondary pulmonary hypertension: Secondary | ICD-10-CM | POA: Diagnosis not present

## 2015-10-07 DIAGNOSIS — R079 Chest pain, unspecified: Secondary | ICD-10-CM

## 2015-10-07 DIAGNOSIS — R0600 Dyspnea, unspecified: Secondary | ICD-10-CM

## 2015-10-07 LAB — D-DIMER, QUANTITATIVE: D-Dimer, Quant: 1.79 mcg/mL FEU — ABNORMAL HIGH (ref <0.50)

## 2015-10-07 LAB — BRAIN NATRIURETIC PEPTIDE: Brain Natriuretic Peptide: 33.4 pg/mL (ref <100)

## 2015-10-07 NOTE — Telephone Encounter (Signed)
-----   Message from Larey Dresser, MD sent at 10/06/2015  1:31 PM EDT ----- Webb Silversmith,  There is some question about pulmonary hypertension on an echo.  Could you get her set up for a CPX test in the [facility]? Put in order dyspnea and pulmonary hypertension.  Also need D dimer and BNP drawn.  Thanks

## 2015-10-07 NOTE — Telephone Encounter (Signed)
D-Dimer 1.79  Per Dr Orpah Greek has been having chest pain since February, recent abnormal echo.

## 2015-10-07 NOTE — Telephone Encounter (Signed)
Agree with plan.  She has been having atypical chest pain since 2/17 which triggered echo.  Echo showed some dilation of IVC and ?mild pulmonary hypertension but normal RV size/function.  BNP was normal but D dimer was done and elevated.  This suggests possibility of PE => had surgery (lumpectomy) last October but nothing since.  Symptoms began in February. Also possibly inflammation => no infectious symptoms, cannot rule out rheumatologic diagnosis (has family history of this).   - First step will be CT chest for PE.  If this is negative, would proceed with CPX as planned.

## 2015-10-07 NOTE — Telephone Encounter (Signed)
Per Dr Aundra Dubin--  Order CTA chest to rule out a PE. Order BMET/ANA/Sed rate/TSH/CBCd tomorrow.   Pt is scheduled for CTA chest 10/08/15 at 10:45AM.  I spoke with patient-pt states BMET/CBC/TSH were done at PCP last week. Pt has a copy of lab and states BUN 16/ Cr 0.9 last week.  Pt is going to bring a copy of lab when she comes for CTA chest tomorrow.  Pt is scheduled for ANA/Sed rate tomorrow when she comes for CTA.

## 2015-10-08 ENCOUNTER — Ambulatory Visit (INDEPENDENT_AMBULATORY_CARE_PROVIDER_SITE_OTHER)
Admission: RE | Admit: 2015-10-08 | Discharge: 2015-10-08 | Disposition: A | Discharge status: Home / Self Care | Payer: Managed Care, Other (non HMO) | Source: Ambulatory Visit | Attending: Cardiology | Admitting: Cardiology

## 2015-10-08 ENCOUNTER — Other Ambulatory Visit (INDEPENDENT_AMBULATORY_CARE_PROVIDER_SITE_OTHER): Payer: Managed Care, Other (non HMO)

## 2015-10-08 ENCOUNTER — Other Ambulatory Visit: Payer: Self-pay | Admitting: Obstetrics and Gynecology

## 2015-10-08 DIAGNOSIS — I272 Other secondary pulmonary hypertension: Secondary | ICD-10-CM

## 2015-10-08 DIAGNOSIS — R0600 Dyspnea, unspecified: Secondary | ICD-10-CM

## 2015-10-08 DIAGNOSIS — R079 Chest pain, unspecified: Secondary | ICD-10-CM

## 2015-10-08 DIAGNOSIS — Z1231 Encounter for screening mammogram for malignant neoplasm of breast: Secondary | ICD-10-CM

## 2015-10-08 MED ORDER — IOPAMIDOL (ISOVUE-370) INJECTION 76%
80.0000 mL | Freq: Once | INTRAVENOUS | Status: AC | PRN
  Administered 2015-10-08: 80 mL via INTRAVENOUS

## 2015-10-09 LAB — ANTI-NUCLEAR AB-TITER (ANA TITER): ANA Titer 1: 1:40 {titer} — ABNORMAL HIGH

## 2015-10-09 LAB — SEDIMENTATION RATE: SED RATE: 4 mm/h (ref 0–20)

## 2015-10-09 LAB — ANA: Anti Nuclear Antibody(ANA): POSITIVE — AB

## 2015-10-10 ENCOUNTER — Ambulatory Visit (HOSPITAL_COMMUNITY): Payer: Managed Care, Other (non HMO) | Attending: Cardiology

## 2015-10-10 DIAGNOSIS — R0600 Dyspnea, unspecified: Secondary | ICD-10-CM | POA: Insufficient documentation

## 2015-10-10 DIAGNOSIS — I272 Other secondary pulmonary hypertension: Secondary | ICD-10-CM | POA: Insufficient documentation

## 2015-10-10 DIAGNOSIS — R002 Palpitations: Secondary | ICD-10-CM | POA: Insufficient documentation

## 2015-10-10 DIAGNOSIS — R079 Chest pain, unspecified: Secondary | ICD-10-CM | POA: Insufficient documentation

## 2015-10-11 DIAGNOSIS — R0600 Dyspnea, unspecified: Secondary | ICD-10-CM | POA: Diagnosis not present

## 2015-10-15 ENCOUNTER — Telehealth: Payer: Self-pay | Admitting: Cardiology

## 2015-10-15 DIAGNOSIS — R079 Chest pain, unspecified: Secondary | ICD-10-CM

## 2015-10-15 NOTE — Telephone Encounter (Signed)
Spoke with patient about recent CPX results.

## 2015-10-15 NOTE — Telephone Encounter (Signed)
Pt is going to cancel appt with Dr Aundra Dubin 10/25/15, will plan to repeat echo in Nov and follow up with Dr Aundra Dubin following echo in Nov.

## 2015-10-15 NOTE — Telephone Encounter (Signed)
LMTCB

## 2015-10-15 NOTE — Telephone Encounter (Signed)
Follow Up:; ° ° °Returning your call. °

## 2015-10-15 NOTE — Telephone Encounter (Signed)
-----   Message from Larey Dresser, MD sent at 10/11/2015  4:03 PM EDT ----- Let's do maybe 4 months, make it November.  Don't want to wait too long if really abnormal (which I doubt).  ----- Message -----    From: Katrine Coho, RN    Sent: 10/11/2015   3:59 PM      To: Larey Dresser, MD  She had her echo in July- did you want to repeat in Oct or do you want to repeat 6 months? ----- Message -----    From: Larey Dresser, MD    Sent: 10/11/2015   3:54 PM      To: Katrine Coho, RN  If she has no other questions and feels ok, can come in 10/17 and will get echo just before that.  If she wants to keep appt, would be fine but think she's ok.    ----- Message -----    From: Katrine Coho, RN    Sent: 10/11/2015   2:12 PM      To: Larey Dresser, MD  She is scheduled to see you Aug 4, does she need to keep that appt?

## 2015-10-24 ENCOUNTER — Ambulatory Visit: Payer: Managed Care, Other (non HMO)

## 2015-10-25 ENCOUNTER — Ambulatory Visit: Payer: Managed Care, Other (non HMO) | Admitting: Cardiology

## 2015-11-05 ENCOUNTER — Ambulatory Visit
Admission: RE | Admit: 2015-11-05 | Discharge: 2015-11-05 | Disposition: A | Discharge status: Home / Self Care | Payer: Managed Care, Other (non HMO) | Source: Ambulatory Visit | Attending: Obstetrics and Gynecology | Admitting: Obstetrics and Gynecology

## 2015-11-05 DIAGNOSIS — Z1231 Encounter for screening mammogram for malignant neoplasm of breast: Secondary | ICD-10-CM

## 2015-12-18 ENCOUNTER — Other Ambulatory Visit: Payer: Self-pay | Admitting: Internal Medicine

## 2015-12-18 DIAGNOSIS — R7989 Other specified abnormal findings of blood chemistry: Secondary | ICD-10-CM

## 2015-12-25 ENCOUNTER — Ambulatory Visit
Admission: RE | Admit: 2015-12-25 | Discharge: 2015-12-25 | Disposition: A | Discharge status: Home / Self Care | Payer: Managed Care, Other (non HMO) | Source: Ambulatory Visit | Attending: Internal Medicine | Admitting: Internal Medicine

## 2015-12-25 DIAGNOSIS — R7989 Other specified abnormal findings of blood chemistry: Secondary | ICD-10-CM

## 2016-01-16 ENCOUNTER — Other Ambulatory Visit: Payer: Self-pay | Admitting: Internal Medicine

## 2016-01-16 DIAGNOSIS — G43811 Other migraine, intractable, with status migrainosus: Secondary | ICD-10-CM

## 2016-01-17 ENCOUNTER — Other Ambulatory Visit: Payer: Managed Care, Other (non HMO)

## 2016-01-19 ENCOUNTER — Ambulatory Visit
Admission: RE | Admit: 2016-01-19 | Discharge: 2016-01-19 | Disposition: A | Discharge status: Home / Self Care | Payer: Managed Care, Other (non HMO) | Source: Ambulatory Visit | Attending: Internal Medicine | Admitting: Internal Medicine

## 2016-01-19 DIAGNOSIS — G43811 Other migraine, intractable, with status migrainosus: Secondary | ICD-10-CM

## 2016-01-19 MED ORDER — GADOBENATE DIMEGLUMINE 529 MG/ML IV SOLN
12.0000 mL | Freq: Once | INTRAVENOUS | Status: AC | PRN
  Administered 2016-01-19: 12 mL via INTRAVENOUS

## 2016-01-28 ENCOUNTER — Other Ambulatory Visit: Payer: Managed Care, Other (non HMO)

## 2016-02-07 ENCOUNTER — Other Ambulatory Visit: Payer: Self-pay

## 2016-02-07 ENCOUNTER — Ambulatory Visit (HOSPITAL_COMMUNITY): Payer: Managed Care, Other (non HMO) | Attending: Internal Medicine

## 2016-02-07 DIAGNOSIS — I081 Rheumatic disorders of both mitral and tricuspid valves: Secondary | ICD-10-CM | POA: Insufficient documentation

## 2016-02-07 DIAGNOSIS — R079 Chest pain, unspecified: Secondary | ICD-10-CM | POA: Insufficient documentation

## 2016-02-10 ENCOUNTER — Telehealth: Payer: Self-pay | Admitting: Cardiology

## 2016-02-10 NOTE — Telephone Encounter (Signed)
New message: ° ° ° °Pt is returning call. °

## 2016-02-10 NOTE — Telephone Encounter (Signed)
Spoke with patient about recent echo results 

## 2016-02-11 ENCOUNTER — Ambulatory Visit: Payer: Managed Care, Other (non HMO) | Admitting: Neurology

## 2016-05-05 DIAGNOSIS — Z111 Encounter for screening for respiratory tuberculosis: Secondary | ICD-10-CM | POA: Diagnosis not present

## 2016-07-15 DIAGNOSIS — R7989 Other specified abnormal findings of blood chemistry: Secondary | ICD-10-CM | POA: Diagnosis not present

## 2016-07-15 DIAGNOSIS — I7300 Raynaud's syndrome without gangrene: Secondary | ICD-10-CM | POA: Diagnosis not present

## 2016-07-15 DIAGNOSIS — E063 Autoimmune thyroiditis: Secondary | ICD-10-CM | POA: Diagnosis not present

## 2016-07-15 DIAGNOSIS — Z8632 Personal history of gestational diabetes: Secondary | ICD-10-CM | POA: Diagnosis not present

## 2016-07-30 DIAGNOSIS — R102 Pelvic and perineal pain: Secondary | ICD-10-CM | POA: Diagnosis not present

## 2016-07-30 DIAGNOSIS — N83299 Other ovarian cyst, unspecified side: Secondary | ICD-10-CM | POA: Diagnosis not present

## 2016-08-05 ENCOUNTER — Encounter: Payer: Self-pay | Admitting: Gynecology

## 2016-08-18 DIAGNOSIS — H5213 Myopia, bilateral: Secondary | ICD-10-CM | POA: Diagnosis not present

## 2016-09-10 DIAGNOSIS — J069 Acute upper respiratory infection, unspecified: Secondary | ICD-10-CM | POA: Diagnosis not present

## 2016-09-10 DIAGNOSIS — J029 Acute pharyngitis, unspecified: Secondary | ICD-10-CM | POA: Diagnosis not present

## 2016-09-10 DIAGNOSIS — R5383 Other fatigue: Secondary | ICD-10-CM | POA: Diagnosis not present

## 2016-09-10 DIAGNOSIS — J358 Other chronic diseases of tonsils and adenoids: Secondary | ICD-10-CM | POA: Diagnosis not present

## 2016-09-29 DIAGNOSIS — R5383 Other fatigue: Secondary | ICD-10-CM | POA: Diagnosis not present

## 2016-09-29 DIAGNOSIS — E559 Vitamin D deficiency, unspecified: Secondary | ICD-10-CM | POA: Diagnosis not present

## 2016-09-29 DIAGNOSIS — Z0000 Encounter for general adult medical examination without abnormal findings: Secondary | ICD-10-CM | POA: Diagnosis not present

## 2016-10-05 DIAGNOSIS — Z86010 Personal history of colonic polyps: Secondary | ICD-10-CM | POA: Diagnosis not present

## 2016-10-05 DIAGNOSIS — Z8371 Family history of colonic polyps: Secondary | ICD-10-CM | POA: Diagnosis not present

## 2016-10-05 DIAGNOSIS — Z1211 Encounter for screening for malignant neoplasm of colon: Secondary | ICD-10-CM | POA: Diagnosis not present

## 2016-10-05 DIAGNOSIS — Z0000 Encounter for general adult medical examination without abnormal findings: Secondary | ICD-10-CM | POA: Diagnosis not present

## 2016-10-05 DIAGNOSIS — B002 Herpesviral gingivostomatitis and pharyngotonsillitis: Secondary | ICD-10-CM | POA: Diagnosis not present

## 2016-10-05 DIAGNOSIS — K219 Gastro-esophageal reflux disease without esophagitis: Secondary | ICD-10-CM | POA: Diagnosis not present

## 2016-10-05 DIAGNOSIS — K5909 Other constipation: Secondary | ICD-10-CM | POA: Diagnosis not present

## 2016-10-05 DIAGNOSIS — G43909 Migraine, unspecified, not intractable, without status migrainosus: Secondary | ICD-10-CM | POA: Diagnosis not present

## 2016-10-13 ENCOUNTER — Other Ambulatory Visit: Payer: Self-pay | Admitting: Obstetrics and Gynecology

## 2016-10-13 DIAGNOSIS — Z1231 Encounter for screening mammogram for malignant neoplasm of breast: Secondary | ICD-10-CM

## 2016-11-05 ENCOUNTER — Ambulatory Visit: Payer: Managed Care, Other (non HMO)

## 2016-11-24 DIAGNOSIS — D225 Melanocytic nevi of trunk: Secondary | ICD-10-CM | POA: Diagnosis not present

## 2016-11-24 DIAGNOSIS — D2262 Melanocytic nevi of left upper limb, including shoulder: Secondary | ICD-10-CM | POA: Diagnosis not present

## 2016-11-24 DIAGNOSIS — L570 Actinic keratosis: Secondary | ICD-10-CM | POA: Diagnosis not present

## 2016-11-24 DIAGNOSIS — D2261 Melanocytic nevi of right upper limb, including shoulder: Secondary | ICD-10-CM | POA: Diagnosis not present

## 2016-11-24 DIAGNOSIS — D2221 Melanocytic nevi of right ear and external auricular canal: Secondary | ICD-10-CM | POA: Diagnosis not present

## 2016-11-27 ENCOUNTER — Ambulatory Visit
Admission: RE | Admit: 2016-11-27 | Discharge: 2016-11-27 | Disposition: A | Discharge status: Home / Self Care | Payer: BLUE CROSS/BLUE SHIELD | Source: Ambulatory Visit | Attending: Obstetrics and Gynecology | Admitting: Obstetrics and Gynecology

## 2016-11-27 DIAGNOSIS — Z1231 Encounter for screening mammogram for malignant neoplasm of breast: Secondary | ICD-10-CM | POA: Diagnosis not present

## 2016-12-09 DIAGNOSIS — S76311D Strain of muscle, fascia and tendon of the posterior muscle group at thigh level, right thigh, subsequent encounter: Secondary | ICD-10-CM | POA: Diagnosis not present

## 2016-12-16 DIAGNOSIS — E063 Autoimmune thyroiditis: Secondary | ICD-10-CM | POA: Diagnosis not present

## 2016-12-16 DIAGNOSIS — S76311D Strain of muscle, fascia and tendon of the posterior muscle group at thigh level, right thigh, subsequent encounter: Secondary | ICD-10-CM | POA: Diagnosis not present

## 2016-12-17 DIAGNOSIS — S76311D Strain of muscle, fascia and tendon of the posterior muscle group at thigh level, right thigh, subsequent encounter: Secondary | ICD-10-CM | POA: Diagnosis not present

## 2016-12-21 DIAGNOSIS — S76311D Strain of muscle, fascia and tendon of the posterior muscle group at thigh level, right thigh, subsequent encounter: Secondary | ICD-10-CM | POA: Diagnosis not present

## 2016-12-23 DIAGNOSIS — S0502XA Injury of conjunctiva and corneal abrasion without foreign body, left eye, initial encounter: Secondary | ICD-10-CM | POA: Diagnosis not present

## 2016-12-30 DIAGNOSIS — S76311D Strain of muscle, fascia and tendon of the posterior muscle group at thigh level, right thigh, subsequent encounter: Secondary | ICD-10-CM | POA: Diagnosis not present

## 2016-12-31 DIAGNOSIS — Z23 Encounter for immunization: Secondary | ICD-10-CM | POA: Diagnosis not present

## 2017-01-04 DIAGNOSIS — S76311D Strain of muscle, fascia and tendon of the posterior muscle group at thigh level, right thigh, subsequent encounter: Secondary | ICD-10-CM | POA: Diagnosis not present

## 2017-01-06 DIAGNOSIS — Z01419 Encounter for gynecological examination (general) (routine) without abnormal findings: Secondary | ICD-10-CM | POA: Diagnosis not present

## 2017-01-06 DIAGNOSIS — Z6820 Body mass index (BMI) 20.0-20.9, adult: Secondary | ICD-10-CM | POA: Diagnosis not present

## 2017-01-14 DIAGNOSIS — S76311D Strain of muscle, fascia and tendon of the posterior muscle group at thigh level, right thigh, subsequent encounter: Secondary | ICD-10-CM | POA: Diagnosis not present

## 2017-01-15 DIAGNOSIS — S76311D Strain of muscle, fascia and tendon of the posterior muscle group at thigh level, right thigh, subsequent encounter: Secondary | ICD-10-CM | POA: Diagnosis not present

## 2017-01-27 DIAGNOSIS — S76311D Strain of muscle, fascia and tendon of the posterior muscle group at thigh level, right thigh, subsequent encounter: Secondary | ICD-10-CM | POA: Diagnosis not present

## 2017-05-25 DIAGNOSIS — Z111 Encounter for screening for respiratory tuberculosis: Secondary | ICD-10-CM | POA: Diagnosis not present

## 2017-07-08 DIAGNOSIS — L720 Epidermal cyst: Secondary | ICD-10-CM | POA: Diagnosis not present

## 2017-07-19 DIAGNOSIS — E063 Autoimmune thyroiditis: Secondary | ICD-10-CM | POA: Diagnosis not present

## 2017-07-19 DIAGNOSIS — Z8632 Personal history of gestational diabetes: Secondary | ICD-10-CM | POA: Diagnosis not present

## 2017-07-19 DIAGNOSIS — I7300 Raynaud's syndrome without gangrene: Secondary | ICD-10-CM | POA: Diagnosis not present

## 2017-07-19 DIAGNOSIS — R6889 Other general symptoms and signs: Secondary | ICD-10-CM | POA: Diagnosis not present

## 2017-07-23 DIAGNOSIS — Z8632 Personal history of gestational diabetes: Secondary | ICD-10-CM | POA: Diagnosis not present

## 2017-07-23 DIAGNOSIS — D7589 Other specified diseases of blood and blood-forming organs: Secondary | ICD-10-CM | POA: Diagnosis not present

## 2017-07-23 DIAGNOSIS — E063 Autoimmune thyroiditis: Secondary | ICD-10-CM | POA: Diagnosis not present

## 2017-07-26 DIAGNOSIS — D7589 Other specified diseases of blood and blood-forming organs: Secondary | ICD-10-CM | POA: Diagnosis not present

## 2017-08-18 DIAGNOSIS — E876 Hypokalemia: Secondary | ICD-10-CM | POA: Diagnosis not present

## 2017-08-30 DIAGNOSIS — Z681 Body mass index (BMI) 19 or less, adult: Secondary | ICD-10-CM | POA: Diagnosis not present

## 2017-08-30 DIAGNOSIS — H6692 Otitis media, unspecified, left ear: Secondary | ICD-10-CM | POA: Diagnosis not present

## 2017-08-30 DIAGNOSIS — G500 Trigeminal neuralgia: Secondary | ICD-10-CM | POA: Diagnosis not present

## 2017-09-06 ENCOUNTER — Ambulatory Visit (INDEPENDENT_AMBULATORY_CARE_PROVIDER_SITE_OTHER): Payer: BLUE CROSS/BLUE SHIELD | Admitting: Neurology

## 2017-09-06 ENCOUNTER — Encounter: Payer: Self-pay | Admitting: Neurology

## 2017-09-06 VITALS — BP 100/70 | HR 70 | Ht 69.0 in | Wt 134.4 lb

## 2017-09-06 DIAGNOSIS — R519 Headache, unspecified: Secondary | ICD-10-CM

## 2017-09-06 DIAGNOSIS — R51 Headache: Secondary | ICD-10-CM | POA: Diagnosis not present

## 2017-09-06 DIAGNOSIS — G501 Atypical facial pain: Secondary | ICD-10-CM | POA: Diagnosis not present

## 2017-09-06 MED ORDER — TOPIRAMATE ER 25 MG PO CAP24: 25.0000 mg | ORAL_CAPSULE | Freq: Every day | ORAL | 11 refills | Status: DC

## 2017-09-06 MED ORDER — METHYLPREDNISOLONE 4 MG PO TBPK: ORAL_TABLET | ORAL | 0 refills | Status: DC

## 2017-09-06 NOTE — Progress Notes (Signed)
Follow-up Visit   Date: 09/06/17    Christy Porter MRN: [id number] DOB: August 29, 1970   Interim History: Christy Porter is a 47 y.o. left-handed Caucasian female with history of HELLP syndrome and vitamin D deficiency returning to the clinic for follow-up of headaches.  The patient was accompanied to the clinic by self.   History of present illness: 02/28/2013 Follow-up of migraine:  History of headaches started in her teens, which improved after stopping birth control (48s).  Since then, she gets unilateral headaches located over the left temporal and parietal region which responds well to NSAIDs and Fioricet.  Headaches are not associated with photophobia, phonophobia, nausea, or vomiting. Around November 2014, she developed tingling, "warm water sensation" over the left side of her head (pariental region, temple, inner ear).  She would have intermittent brief sharp pain over the same area and including her left eye.  She tried ibuprofen and fioricet which did not help.    She saw Dr. Jannifer Franklin in November 2014 for headaches at which time prednisone dosepak was started, but this did not help. Topamax 25mg  daily was started, but she has not noticed any change. She also started developing paresthesias over the left side of her face.   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  UPDATE 04/26/2015:  She was last seen in the office in December 2014 and return today with complaints of headaches. Starting in October, she began having spells of left retroorbital pain with associated tingling over the cheek on the left. She endorses scintillating scotomas, no nausea/vomiting.  She has been using ibuprofen for the past 3 weeks daily, without any benefit.  She was previously taking topiramate 25mg  daily which controlled her headaches, but only took this for 6 weeks.    UPDATE 09/06/2017: Christy Porter is here for a return visit with worsening headache over the past 6 weeks.  She was doing well until earlier in May when her left sided pain and headaches returned.  She has constant left ear fullness and pain, which radiates to the temples. She has tenderness over the ear and temporal region, as well as laying on her left side.  She has some numbness and tingling which radiates into her face.  She has been out of Trokendi for > 1 year, which has been effective in the past.  She denies any pain with chewing.  Rarely, she gets nausea.  No light sensitivity.  She tried NSAIDs which eased some of her pain.  She also endorses being under a significant amount of stress lately with a good friend having medication complications from heart valve replacement and her neighbors being killed in their house fire.     Medications:  Current Outpatient Medications on File Prior to Visit  Medication Sig Dispense Refill  . hydrochlorothiazide 25 MG tablet Take 25 mg by mouth daily.      Marland Kitchen HYDROcodone-acetaminophen (NORCO) 5-325 MG per tablet Take 1 tablet by mouth every 6 (six) hours as needed for moderate pain. 30 tablet 0  . Linaclotide (LINZESS) 290 MCG CAPS capsule Take 290 mcg by mouth once a week.    . potassium chloride SA (K-DUR,KLOR-CON) 20 MEQ tablet TK 1 T PO ONCE A DAY FOR 30 DAYS  0  . Topiramate ER 25 MG CP24 Take 25 mg by mouth daily. 14 capsule 0   No current facility-administered medications  on file prior to visit.     Allergies: No Known Allergies  Review of Systems:  CONSTITUTIONAL: No fevers, chills, night sweats, or weight loss.  EYES: No visual changes or eye pain ENT: No hearing changes.  No history of nose bleeds.   RESPIRATORY: No cough, wheezing and shortness of breath.   CARDIOVASCULAR: Negative for chest pain, and palpitations.   GI: Negative for abdominal discomfort, blood in stools or black stools.  No recent change in bowel habits.   GU:  No history of incontinence.     MUSCLOSKELETAL: No history of joint pain or swelling.  No myalgias.   SKIN: Negative for lesions, rash, and itching.   ENDOCRINE: Negative for cold or heat intolerance, polydipsia or goiter.   PSYCH:  No  depression or anxiety symptoms.   NEURO: As Above.   Vital Signs:  BP 100/70   Pulse 70   Ht 5\' 9"  (1.753 m)   Wt 134 lb 6 oz (61 kg)   SpO2 98%   BMI 19.84 kg/m   General Medical Exam:   General:  Well appearing, comfortable  Eyes/ENT: see cranial nerve examination.   Neck: No masses appreciated.  Full range of motion without tenderness.  No carotid bruits. Respiratory:  Clear to auscultation, good air entry bilaterally.   Cardiac:  Regular rate and rhythm, no murmur.   Ext:  No edema  Neurological Exam: MENTAL STATUS including orientation to time, place, person, recent and remote memory, attention span and concentration, language, and fund of knowledge is normal.  Speech is not dysarthric.  CRANIAL NERVES: No visual field defects. Normal fundoscopic exam.  No ophthalmic bruit.  Pupils equal round and reactive to light.  Normal conjugate, extra-ocular eye movements in all directions of gaze.  No ptosis. Normal facial sensation.  Face is symmetric. Palate elevates symmetrically.  Tongue is midline.  MOTOR:  Motor strength is 5/5 in all extremities.  No pronator drift.  Tone is normal.    MSRs:  Reflexes are 2+/4 throughout.  SENSORY:  Intact to vibration, temperature, and pin prick.  COORDINATION/GAIT:  Normal finger-to- nose-finger.  Intact rapid alternating movements bilaterally.  Gait narrow based and stable.   Data: MRI brain 01/20/2016:  Normal  IMPRESSION/PLAN: Atypical facial pain with chronic daily headache, similar to previous migraines.  Exam is non-focal and history does not have any worrisome characteristics.   - Start medrol dosepak to break headache cycle  - Start Trokendi 25mg  daily, which she has responded to in the past  Thank you for allowing me to  participate in patient's care.  If I can answer any additional questions, I would be pleased to do so.    Sincerely,    Abhijay Morriss K. Posey Pronto, DO

## 2017-09-08 ENCOUNTER — Telehealth: Payer: Self-pay | Admitting: Neurology

## 2017-09-09 DIAGNOSIS — G500 Trigeminal neuralgia: Secondary | ICD-10-CM | POA: Diagnosis not present

## 2017-09-09 DIAGNOSIS — Z681 Body mass index (BMI) 19 or less, adult: Secondary | ICD-10-CM | POA: Diagnosis not present

## 2017-09-09 DIAGNOSIS — H9202 Otalgia, left ear: Secondary | ICD-10-CM | POA: Diagnosis not present

## 2017-09-09 NOTE — Telephone Encounter (Signed)
I spoke with patient and she said that she is still having trouble with her ear.  She feels like there is water in it.  She is going to call her PCP about this but was wondering if maybe she needs a CT or further testing.

## 2017-09-09 NOTE — Telephone Encounter (Signed)
Pt's returning phone call.  Best call back# [phone].

## 2017-09-09 NOTE — Telephone Encounter (Signed)
Left message for patient to call me back. 

## 2017-09-09 NOTE — Telephone Encounter (Signed)
Would recommend that she talk to Dr. Virgina Jock about her ear fullness as he may want dedicated imaging of the ear/sinus.  If not, we can order CT head and CTA head.

## 2017-09-10 ENCOUNTER — Other Ambulatory Visit: Payer: Self-pay | Admitting: *Deleted

## 2017-09-10 DIAGNOSIS — R519 Headache, unspecified: Secondary | ICD-10-CM

## 2017-09-10 DIAGNOSIS — G501 Atypical facial pain: Secondary | ICD-10-CM

## 2017-09-10 DIAGNOSIS — R51 Headache: Secondary | ICD-10-CM

## 2017-09-10 DIAGNOSIS — H938X9 Other specified disorders of ear, unspecified ear: Secondary | ICD-10-CM

## 2017-09-10 NOTE — Telephone Encounter (Signed)
I spoke with patient and she would prefer for Dr. Posey Pronto to order these tests.  Orders placed.

## 2017-09-13 ENCOUNTER — Other Ambulatory Visit: Payer: Self-pay | Admitting: *Deleted

## 2017-09-13 ENCOUNTER — Encounter: Payer: Self-pay | Admitting: *Deleted

## 2017-09-13 ENCOUNTER — Telehealth: Payer: Self-pay | Admitting: *Deleted

## 2017-09-13 ENCOUNTER — Ambulatory Visit
Admission: RE | Admit: 2017-09-13 | Discharge: 2017-09-13 | Disposition: A | Discharge status: Home / Self Care | Payer: BLUE CROSS/BLUE SHIELD | Source: Ambulatory Visit | Attending: Neurology | Admitting: Neurology

## 2017-09-13 ENCOUNTER — Other Ambulatory Visit: Payer: BLUE CROSS/BLUE SHIELD

## 2017-09-13 DIAGNOSIS — H938X9 Other specified disorders of ear, unspecified ear: Secondary | ICD-10-CM

## 2017-09-13 DIAGNOSIS — G501 Atypical facial pain: Secondary | ICD-10-CM

## 2017-09-13 DIAGNOSIS — R519 Headache, unspecified: Secondary | ICD-10-CM

## 2017-09-13 DIAGNOSIS — R51 Headache: Principal | ICD-10-CM

## 2017-09-13 DIAGNOSIS — H9202 Otalgia, left ear: Secondary | ICD-10-CM | POA: Diagnosis not present

## 2017-09-13 MED ORDER — IOPAMIDOL (ISOVUE-300) INJECTION 61%
75.0000 mL | Freq: Once | INTRAVENOUS | Status: AC | PRN
  Administered 2017-09-13: 75 mL via INTRAVENOUS

## 2017-09-13 NOTE — Telephone Encounter (Signed)
Results sent via My Chart.  

## 2017-09-13 NOTE — Telephone Encounter (Signed)
-----   Message from Alda Berthold, DO sent at 09/13/2017  2:29 PM EDT ----- Please inform patient that CT is normal.  Thanks.

## 2017-09-13 NOTE — Progress Notes (Unsigned)
ct 

## 2017-09-14 ENCOUNTER — Other Ambulatory Visit: Payer: Self-pay | Admitting: *Deleted

## 2017-09-14 DIAGNOSIS — R42 Dizziness and giddiness: Secondary | ICD-10-CM

## 2017-09-14 DIAGNOSIS — R51 Headache: Principal | ICD-10-CM

## 2017-09-14 DIAGNOSIS — R519 Headache, unspecified: Secondary | ICD-10-CM

## 2017-09-15 ENCOUNTER — Other Ambulatory Visit: Payer: BLUE CROSS/BLUE SHIELD

## 2017-09-28 DIAGNOSIS — H5213 Myopia, bilateral: Secondary | ICD-10-CM | POA: Diagnosis not present

## 2017-09-30 DIAGNOSIS — E876 Hypokalemia: Secondary | ICD-10-CM | POA: Diagnosis not present

## 2017-10-15 DIAGNOSIS — E559 Vitamin D deficiency, unspecified: Secondary | ICD-10-CM | POA: Diagnosis not present

## 2017-10-15 DIAGNOSIS — Z0000 Encounter for general adult medical examination without abnormal findings: Secondary | ICD-10-CM | POA: Diagnosis not present

## 2017-10-25 ENCOUNTER — Other Ambulatory Visit: Payer: Self-pay | Admitting: Internal Medicine

## 2017-10-25 DIAGNOSIS — Z0000 Encounter for general adult medical examination without abnormal findings: Secondary | ICD-10-CM | POA: Diagnosis not present

## 2017-10-25 DIAGNOSIS — G43909 Migraine, unspecified, not intractable, without status migrainosus: Secondary | ICD-10-CM | POA: Diagnosis not present

## 2017-10-25 DIAGNOSIS — I809 Phlebitis and thrombophlebitis of unspecified site: Secondary | ICD-10-CM

## 2017-10-25 DIAGNOSIS — Z1389 Encounter for screening for other disorder: Secondary | ICD-10-CM | POA: Diagnosis not present

## 2017-10-25 DIAGNOSIS — K5900 Constipation, unspecified: Secondary | ICD-10-CM | POA: Diagnosis not present

## 2017-10-25 DIAGNOSIS — H9202 Otalgia, left ear: Secondary | ICD-10-CM | POA: Diagnosis not present

## 2017-10-26 ENCOUNTER — Ambulatory Visit
Admission: RE | Admit: 2017-10-26 | Discharge: 2017-10-26 | Disposition: A | Discharge status: Home / Self Care | Payer: BLUE CROSS/BLUE SHIELD | Source: Ambulatory Visit | Attending: Internal Medicine | Admitting: Internal Medicine

## 2017-10-26 DIAGNOSIS — I809 Phlebitis and thrombophlebitis of unspecified site: Secondary | ICD-10-CM

## 2017-10-26 DIAGNOSIS — R600 Localized edema: Secondary | ICD-10-CM | POA: Diagnosis not present

## 2017-11-09 ENCOUNTER — Other Ambulatory Visit: Payer: Self-pay | Admitting: Obstetrics and Gynecology

## 2017-11-09 DIAGNOSIS — Z1231 Encounter for screening mammogram for malignant neoplasm of breast: Secondary | ICD-10-CM

## 2017-11-24 DIAGNOSIS — D225 Melanocytic nevi of trunk: Secondary | ICD-10-CM | POA: Diagnosis not present

## 2017-11-24 DIAGNOSIS — L821 Other seborrheic keratosis: Secondary | ICD-10-CM | POA: Diagnosis not present

## 2017-11-24 DIAGNOSIS — D2261 Melanocytic nevi of right upper limb, including shoulder: Secondary | ICD-10-CM | POA: Diagnosis not present

## 2017-11-24 DIAGNOSIS — D2262 Melanocytic nevi of left upper limb, including shoulder: Secondary | ICD-10-CM | POA: Diagnosis not present

## 2017-12-06 ENCOUNTER — Ambulatory Visit
Admission: RE | Admit: 2017-12-06 | Discharge: 2017-12-06 | Disposition: A | Discharge status: Home / Self Care | Payer: BLUE CROSS/BLUE SHIELD | Source: Ambulatory Visit | Attending: Obstetrics and Gynecology | Admitting: Obstetrics and Gynecology

## 2017-12-06 DIAGNOSIS — Z1231 Encounter for screening mammogram for malignant neoplasm of breast: Secondary | ICD-10-CM | POA: Diagnosis not present

## 2017-12-18 DIAGNOSIS — Z23 Encounter for immunization: Secondary | ICD-10-CM | POA: Diagnosis not present

## 2017-12-22 DIAGNOSIS — M1712 Unilateral primary osteoarthritis, left knee: Secondary | ICD-10-CM | POA: Diagnosis not present

## 2017-12-22 DIAGNOSIS — M2242 Chondromalacia patellae, left knee: Secondary | ICD-10-CM | POA: Diagnosis not present

## 2018-01-04 DIAGNOSIS — M7731 Calcaneal spur, right foot: Secondary | ICD-10-CM | POA: Diagnosis not present

## 2018-02-07 DIAGNOSIS — M7731 Calcaneal spur, right foot: Secondary | ICD-10-CM | POA: Diagnosis not present

## 2018-02-11 ENCOUNTER — Encounter: Payer: Self-pay | Admitting: Internal Medicine

## 2018-02-15 DIAGNOSIS — Z681 Body mass index (BMI) 19 or less, adult: Secondary | ICD-10-CM | POA: Diagnosis not present

## 2018-02-15 DIAGNOSIS — Z01419 Encounter for gynecological examination (general) (routine) without abnormal findings: Secondary | ICD-10-CM | POA: Diagnosis not present

## 2018-03-28 ENCOUNTER — Encounter

## 2018-03-28 ENCOUNTER — Ambulatory Visit: Payer: BLUE CROSS/BLUE SHIELD | Admitting: Internal Medicine

## 2018-07-06 ENCOUNTER — Telehealth: Payer: Self-pay | Admitting: Internal Medicine

## 2018-07-06 MED ORDER — PANTOPRAZOLE SODIUM 40 MG PO TBEC: 40.0000 mg | DELAYED_RELEASE_TABLET | Freq: Every day | ORAL | 0 refills | Status: DC

## 2018-07-06 NOTE — Telephone Encounter (Signed)
48 year old female with a history of GERD and gastritis.  Previously evaluated by Dr. Earlean Shawl. Of intermittent epigastric pain, more severe episode occurring 3 days ago of upper abdominal pain after eating dinner.  Became severe, though has slowly gotten better but not resolved. Pepcid no real help Mylanta slight help  No known history of gallstones No nausea or vomiting Dysphagia  History of upper endoscopy and colonoscopy in the past She has a virtual visit with me scheduled for 07/18/2018  Possible gastritis.  Cannot exclude biliary pathology.  We will try pantoprazole 40 mg once daily, 30 minutes before breakfast. She is asked to call me back if pain is not improving or if she has another acute attack.  If so we will check CBC, CMP, amylase lipase and an ultrasound of the abdomen.  We will discuss how she is doing at follow-up

## 2018-07-14 ENCOUNTER — Encounter: Payer: Self-pay | Admitting: *Deleted

## 2018-07-18 ENCOUNTER — Other Ambulatory Visit: Payer: Self-pay

## 2018-07-18 ENCOUNTER — Encounter: Payer: Self-pay | Admitting: Internal Medicine

## 2018-07-18 ENCOUNTER — Ambulatory Visit (INDEPENDENT_AMBULATORY_CARE_PROVIDER_SITE_OTHER): Payer: BLUE CROSS/BLUE SHIELD | Admitting: Internal Medicine

## 2018-07-18 VITALS — Ht 69.0 in | Wt 127.0 lb

## 2018-07-18 DIAGNOSIS — Z86010 Personal history of colonic polyps: Secondary | ICD-10-CM | POA: Diagnosis not present

## 2018-07-18 DIAGNOSIS — K5909 Other constipation: Secondary | ICD-10-CM | POA: Diagnosis not present

## 2018-07-18 DIAGNOSIS — K219 Gastro-esophageal reflux disease without esophagitis: Secondary | ICD-10-CM | POA: Diagnosis not present

## 2018-07-18 DIAGNOSIS — R1013 Epigastric pain: Secondary | ICD-10-CM

## 2018-07-18 DIAGNOSIS — Z8371 Family history of colonic polyps: Secondary | ICD-10-CM

## 2018-07-18 NOTE — Progress Notes (Signed)
Patient ID: Christy Porter, female   DOB: March 10, 1971, 48 y.o.   MRN: [id number] This service was provided via telemedicine.   Zoom platform with A/V communication The patient was located at home The provider was located in [place] office The patient did consent to this telephone visit and is aware of possible charges through their insurance for this visit.   The other persons participating in this telemedicine service were  Time spent on call: 16 min  HPI: Christy Porter is a 48 year old female with a past medical history of dyspepsia, GERD, colon polyps, family history of colon polyps, chronic constipation who is seen to evaluate epigastric abdominal pain and to establish care.  She is seen by Zoom platform in the setting of COVID-19 pandemic.  This is her first visit with me.  She previously received GI care from Dr. Earlie Porter.  She sees Dr. Virgina Porter for primary care.  She contacted me several weeks ago with epigastric abdominal pain.  This seem to occur somewhat suddenly and she thought may have been related to eating specific meal.  She has since wondered if starting vitamin C in the setting of COVID-19 pandemic could have contributed.  Tried Pepcid without help.  Tried Mylanta with slight help.  I recommended she begin PPI but due to insurance coverage issues pantoprazole was not able to be filled.  She then started over-the-counter Nexium at 40 mg daily.  She has been doing this for just over a week.  Reports that her epigastric pain which is dull is remarkably better.  The sour stomach feeling is gone.  She has been eating normally.  She estimates symptoms are 90% better.  She was previously having some mild nausea or really more food aversion which has improved.  No vomiting.  No dysphagia or odynophagia.  Bowel habits have been regular for her but she uses Linzess 290 mcg twice a week.  She initially was involved in the pharmacy trials for [place] before the drug was fully approved.  This led  to colonoscopies and discovery that she had adenomatous colon polyps.  She has had 3 colonoscopies the last in 2018 which she reports was normal and she was recommended to have a 5-year follow-up.  Her family history is notable for her father who is had multiple colon polyps throughout his adult life.  Her 19 year old brother had 7 adenomatous polyps and her sister had 5 adenomatous polyps.  No family history of colon cancer.  Past Medical History:  Diagnosis Date  . Colon polyps   . HELLP syndrome   . Hypertension   . Migraine without aura, without mention of intractable migraine without mention of status migrainosus 02/13/2013  . PONV (postoperative nausea and vomiting)   . Vitamin D deficiency     Past Surgical History:  Procedure Laterality Date  . BREAST BIOPSY Left 09/2014  . BREAST EXCISIONAL BIOPSY Left 11/2014  . BREAST LUMPECTOMY WITH RADIOACTIVE SEED LOCALIZATION Left 12/07/2014   Procedure: LEFT BREAST LUMPECTOMY WITH RADIOACTIVE SEED LOCALIZATION AND LEFT CENTRAL DUCT EXCISION;  Surgeon: Christy Luna, MD;  Location: [facility];  Service: General;  Laterality: Left;  . BREAST REDUCTION SURGERY  2000  . CESAREAN SECTION  '08  . perianal skin excision    . vaginal ablation      Outpatient Medications Prior to Visit  Medication Sig Dispense Refill  . esomeprazole (NEXIUM) 40 MG capsule Take 40 mg by mouth daily at 12 noon.    . hydrochlorothiazide  25 MG tablet Take 25 mg by mouth daily.      . Linaclotide (LINZESS) 290 MCG CAPS capsule Take 290 mcg by mouth as directed. Taking twice weekly     No facility-administered medications prior to visit.     No Known Allergies  Family History  Problem Relation Age of Onset  . Hypertension Mother        Living  . Migraines Mother   . Irritable bowel syndrome Mother   . Lupus Maternal Grandmother   . Healthy Father        Living  . Colon polyps Father   . Colon polyps Sister   . Irritable bowel syndrome  Sister   . Colon polyps Brother   . Colon cancer Neg Hx   . Esophageal cancer Neg Hx   . Stomach cancer Neg Hx   . Pancreatic cancer Neg Hx   . Liver disease Neg Hx     Social History   Tobacco Use  . Smoking status: Never Smoker  . Smokeless tobacco: Never Used  Substance Use Topics  . Alcohol use: Yes    Comment: Drinks alcohol on occasion  . Drug use: No    ROS: As per history of present illness, otherwise negative  Ht 5\' 9"  (1.753 m)   Wt 127 lb (57.6 kg)   BMI 18.75 kg/m  No physical exam, virtual visit   ASSESSMENT/PLAN: 48 year old female with a past medical history of dyspepsia, GERD, colon polyps, family history of colon polyps, chronic constipation who is seen to evaluate epigastric abdominal pain and to establish care.  1.  Epigastric pain/dyspepsia --epigastric pain sounds suspicious for gastritis, cannot exclude nonbleeding ulcer.  Improving with PPI therapy.  For now we will continue Nexium 40 mg daily given dramatic improvement.  However I have asked that she notify me around May 15 and if symptoms have failed to improve completely then we can consider further evaluation, medication change and/or EGD. --Continue over-the-counter Nexium 40 mg daily  2.  GERD --some intermittent GERD symptoms now completely resolved with Nexium, see above  3.  Chronic constipation --using Linzess 290 mcg 2 to 3 days/week.  Continue current dose and schedule.  4.  History of colon polyps/family history of colon polyps --her history of adenomatous polyps at an early age combined with her family history of colon polyps are strongly suggestive of a genetic/hereditary etiology.  We discussed this today.  I recommended she consider medical genetics consultation to screen for hereditary polyp syndrome.  We discussed how this can have implications for her immediate family members.  She is very open to this idea and wishes to proceed --Obtain records from Dr. Earlean Porter of prior colonoscopy  and pathology results --Medical genetics referral for personal history of adenomatous colon polyps at a young age and family history of colon polyps in father, brother and sister --Surveillance colonoscopy recommended 2023 (month to be determined after reviewing records from Dr. Earlean Porter).  This date may change based on genetic results.  I will hear from her in 2 or 3 weeks regarding #1.     BL:TJQZE, Jenny Reichmann, [place], [place] [address]

## 2018-07-19 NOTE — Addendum Note (Signed)
Addended by: Larina Bras on: 07/19/2018 11:24 AM   Modules accepted: Orders

## 2018-07-19 NOTE — Patient Instructions (Addendum)
Continue over-the-counter Nexium 40 mg daily. Contact our office around May 15 to let us know how your symptoms are. If you fail to improve by that time, we may need to consider further evaluation.  Continue Linzess 290 mcg 2 to 3 days/week.   You have placed for genetics referral through [facility]. You should receive a phone call regarding an appointment as soon as one has been made. If you do not receive a call regarding an appointment within the next 2-3 weeks, please call our office at [phone] so we can check on this for you.  We will obtain records from Dr. Earlean Shawl of prior colonoscopy and pathology results after receiving signed release from patient.  Surveillance colonoscopy recommended in 2023 (month to be determined after reviewing records from Dr. Earlean Shawl).  This date may change based on genetic results.

## 2018-07-21 ENCOUNTER — Telehealth: Payer: Self-pay | Admitting: Genetic Counselor

## 2018-07-21 NOTE — Telephone Encounter (Signed)
Pt returned my call to schedule a genetic counseling appt w/Karen Florene Glen on 6/29 at 9am. Pt has agreed to the appt date and time

## 2018-07-26 ENCOUNTER — Telehealth: Payer: Self-pay | Admitting: Internal Medicine

## 2018-07-26 NOTE — Telephone Encounter (Signed)
Patient called back after having tried Nexium 40 mg daily for epigastric abdominal pain dyspeptic symptoms; she reports symptoms are not any better and would like to know next steps  My recommendation is for upper endoscopy, this can be scheduled at patient convenience

## 2018-07-27 NOTE — Telephone Encounter (Signed)
Patient has been advised of time/date/location of endoscopy at [facility] 08/01/18 at 10 am. She is also advised of prep and [place] rules verbally. In addition, she has been asked to have a caregiver 32 or older to bring her, stay at the facility and take her home from test since she will be sedated. This information has been mailed to her home address and she will bring her signed forms back to our office.

## 2018-07-29 ENCOUNTER — Telehealth: Payer: Self-pay | Admitting: *Deleted

## 2018-07-29 NOTE — Telephone Encounter (Signed)
LMOM to confirm appt and to go over COVID screening questions.  Will call back

## 2018-07-29 NOTE — Telephone Encounter (Signed)
Covid-19 travel screening questions  Have you traveled in the last 14 days? no If yes where?  Do you now or have you had a fever in the last 14 days? no  Do you have any respiratory symptoms of shortness of breath or cough now or in the last 14 days? no  Do you have any family members or close contacts with diagnosed or suspected Covid-19? No  Pt is aware that care partner will be waiting in car during procedure.  She will wear a mask into building

## 2018-08-01 ENCOUNTER — Other Ambulatory Visit: Payer: Self-pay

## 2018-08-01 ENCOUNTER — Encounter: Payer: Self-pay | Admitting: Internal Medicine

## 2018-08-01 ENCOUNTER — Ambulatory Visit (AMBULATORY_SURGERY_CENTER): Payer: BLUE CROSS/BLUE SHIELD | Admitting: Internal Medicine

## 2018-08-01 VITALS — BP 94/54 | HR 55 | Temp 99.0°F | Resp 22 | Ht 69.0 in | Wt 127.0 lb

## 2018-08-01 DIAGNOSIS — K2970 Gastritis, unspecified, without bleeding: Secondary | ICD-10-CM

## 2018-08-01 DIAGNOSIS — K317 Polyp of stomach and duodenum: Secondary | ICD-10-CM | POA: Diagnosis not present

## 2018-08-01 DIAGNOSIS — K2951 Unspecified chronic gastritis with bleeding: Secondary | ICD-10-CM | POA: Diagnosis not present

## 2018-08-01 DIAGNOSIS — D132 Benign neoplasm of duodenum: Secondary | ICD-10-CM | POA: Diagnosis not present

## 2018-08-01 DIAGNOSIS — K449 Diaphragmatic hernia without obstruction or gangrene: Secondary | ICD-10-CM

## 2018-08-01 DIAGNOSIS — R1013 Epigastric pain: Secondary | ICD-10-CM

## 2018-08-01 MED ORDER — SUCRALFATE 1 GM/10ML PO SUSP: 1.0000 g | Freq: Four times a day (QID) | ORAL | 1 refills | Status: DC

## 2018-08-01 MED ORDER — SODIUM CHLORIDE 0.9 % IV SOLN: 500.0000 mL | Freq: Once | INTRAVENOUS | Status: DC

## 2018-08-01 NOTE — Patient Instructions (Signed)
Await pathology results.  Continue Nexium 40mg  once a day.  Add Sulcrafate 1mg  three times a day before meals and at bedtime as needed.  YOU HAD AN ENDOSCOPIC PROCEDURE TODAY AT [facility]:   Refer to the procedure report that was given to you for any specific questions about what was found during the examination.  If the procedure report does not answer your questions, please call your gastroenterologist to clarify.  If you requested that your care partner not be given the details of your procedure findings, then the procedure report has been included in a sealed envelope for you to review at your convenience later.  YOU SHOULD EXPECT: Some feelings of bloating in the abdomen. Passage of more gas than usual.  Walking can help get rid of the air that was put into your GI tract during the procedure and reduce the bloating. If you had a lower endoscopy (such as a colonoscopy or flexible sigmoidoscopy) you may notice spotting of blood in your stool or on the toilet paper. If you underwent a bowel prep for your procedure, you may not have a normal bowel movement for a few days.  Please Note:  You might notice some irritation and congestion in your nose or some drainage.  This is from the oxygen used during your procedure.  There is no need for concern and it should clear up in a day or so.  SYMPTOMS TO REPORT IMMEDIATELY:     Following upper endoscopy (EGD)  Vomiting of blood or coffee ground material  New chest pain or pain under the shoulder blades  Painful or persistently difficult swallowing  New shortness of breath  Fever of 100F or higher  Black, tarry-looking stools  For urgent or emergent issues, a gastroenterologist can be reached at any hour by calling [phone].   DIET:  We do recommend a small meal at first, but then you may proceed to your regular diet.  Drink plenty of fluids but you should avoid alcoholic beverages for 24 hours.  ACTIVITY:  You should  plan to take it easy for the rest of today and you should NOT DRIVE or use heavy machinery until tomorrow (because of the sedation medicines used during the test).    FOLLOW UP: Our staff will call the number listed on your records the next business day following your procedure to check on you and address any questions or concerns that you may have regarding the information given to you following your procedure. If we do not reach you, we will leave a message.  However, if you are feeling well and you are not experiencing any problems, there is no need to return our call.  We will assume that you have returned to your regular daily activities without incident. We will be calling you two weeks following your procedure to see if you have developed any symptoms of the COVID-19.  If you develop any symptoms before then, please let us know.  If any biopsies were taken you will be contacted by phone or by letter within the next 1-3 weeks.  Please call us at [phone] if you have not heard about the biopsies in 3 weeks.    SIGNATURES/CONFIDENTIALITY: You and/or your care partner have signed paperwork which will be entered into your electronic medical record.  These signatures attest to the fact that that the information above on your After Visit Summary has been reviewed and is understood.  Full responsibility of the confidentiality of  this discharge information lies with you and/or your care-partner.

## 2018-08-01 NOTE — Op Note (Signed)
[facility] Patient Name: Christy Porter Procedure Date: 08/01/2018 10:50 AM MRN: [id number] Endoscopist: Jerene Bears , MD Age: 48 Referring MD:  Date of Birth: 11-07-1970 Gender: Female Account #: [id number] Procedure:                Upper GI endoscopy Indications:              Epigastric abdominal pain, history of                            Gastro-esophageal reflux disease Medicines:                Monitored Anesthesia Care Procedure:                Pre-Anesthesia Assessment:                           - Prior to the procedure, a History and Physical                            was performed, and patient medications and                            allergies were reviewed. The patient's tolerance of                            previous anesthesia was also reviewed. The risks                            and benefits of the procedure and the sedation                            options and risks were discussed with the patient.                            All questions were answered, and informed consent                            was obtained. Prior Anticoagulants: The patient has                            taken no previous anticoagulant or antiplatelet                            agents. ASA Grade Assessment: II - A patient with                            mild systemic disease. After reviewing the risks                            and benefits, the patient was deemed in                            satisfactory condition to undergo the procedure.  After obtaining informed consent, the endoscope was                            passed under direct vision. Throughout the                            procedure, the patient's blood pressure, pulse, and                            oxygen saturations were monitored continuously. The                            Endoscope was introduced through the mouth, and                            advanced to the second part of  duodenum. The upper                            GI endoscopy was accomplished without difficulty.                            The patient tolerated the procedure well. Scope In: Scope Out: Findings:                 In the proximal esophagus there was a small inlet                            patch without nodularity or visible abnormality.                           A 1 cm hiatal hernia was present. The Z-line was                            variable but did not extend above the top of the                            gastric folds.                           The exam of the esophagus was otherwise normal.                           Diffuse moderate inflammation characterized by                            congestion (edema), erosions and erythema was found                            in the cardia, in the gastric fundus and in the                            gastric body. Biopsies were taken with a cold  forceps for histology and Helicobacter pylori                            testing.                           A single 3 mm sessile polyp was found in the                            duodenal bulb. The polyp was removed with a cold                            biopsy forceps. Resection and retrieval were                            complete.                           The exam of the duodenum was otherwise normal.                           Biopsies for histology were taken with a cold                            forceps in the second portion of the duodenum for                            evaluation of celiac disease. Complications:            No immediate complications. Estimated Blood Loss:     Estimated blood loss was minimal. Impression:               - Small inlet patch in the proximal esophagus.                           - Small hiatal hernia.                           - Gastritis. Biopsied.                           - A single duodenal polyp. Resected and retrieved.                            - Otherwise normal examined duodenum. Biopsies were                            taken with a cold forceps for evaluation of celiac                            disease. Recommendation:           - Patient has a contact number available for                            emergencies. The signs and symptoms of potential  delayed complications were discussed with the                            patient. Return to normal activities tomorrow.                            Written discharge instructions were provided to the                            patient.                           - Resume previous diet.                           - Continue present medications, including Nexium 40                            mg once daily. Add sucralfate 1 g three times daily                            before meals and at bedtime as needed (#120, refill                            1).                           - Await pathology results. Jerene Bears, MD 08/01/2018 11:16:32 AM This report has been signed electronically.

## 2018-08-01 NOTE — Progress Notes (Signed)
A/ox3, pleased with MAC, report to RN 

## 2018-08-01 NOTE — Progress Notes (Signed)
Pt's states no medical or surgical changes since previsit or office visit. 

## 2018-08-01 NOTE — Progress Notes (Signed)
Called to room to assist during endoscopic procedure.  Patient ID and intended procedure confirmed with present staff. Received instructions for my participation in the procedure from the performing physician.  

## 2018-08-02 ENCOUNTER — Telehealth: Payer: Self-pay | Admitting: *Deleted

## 2018-08-02 NOTE — Telephone Encounter (Signed)
  Follow up Call-  Call back number 08/01/2018  Post procedure Call Back phone  # [phone]  Permission to leave phone message Yes  Some recent data might be hidden     Patient questions:  Do you have a fever, pain , or abdominal swelling? No. Pain Score  0 *  Have you tolerated food without any problems? Yes.    Have you been able to return to your normal activities? Yes.    Do you have any questions about your discharge instructions: Diet   No. Medications  No. Follow up visit  No.  Do you have questions or concerns about your Care? No.  Actions: * If pain score is 4 or above: No action needed, pain <4.

## 2018-08-04 ENCOUNTER — Telehealth: Payer: Self-pay | Admitting: *Deleted

## 2018-08-04 NOTE — Telephone Encounter (Signed)
1. Have you developed a fever since your procedure? no  2.   Have you had an respiratory symptoms (SOB or cough) since your procedure? no  3.   Have you tested positive for COVID 19 since your procedure no  3.   Have you had any family members/close contacts diagnosed with the COVID 19 since your procedure?  no   If any of these questions are a yes, please inquire if patient has been seen by family doctor and route this note to Tracy Walton, RN.  

## 2018-08-10 DIAGNOSIS — Z03818 Encounter for observation for suspected exposure to other biological agents ruled out: Secondary | ICD-10-CM | POA: Diagnosis not present

## 2018-08-22 NOTE — Progress Notes (Signed)
I have sent release of information for patient to sign so that we can receive records from Dr [facility] office on 2 different occasions. Patient has signed and sent this form back on both occasions. On neither occasion have I received either of these signed forms back. When she comes for another visit, we will attempt to have her sign another release of information in office and I will send to Dr [facility] office at that time.

## 2018-08-25 DIAGNOSIS — M7731 Calcaneal spur, right foot: Secondary | ICD-10-CM | POA: Diagnosis not present

## 2018-08-29 ENCOUNTER — Telehealth: Payer: Self-pay | Admitting: Internal Medicine

## 2018-08-29 NOTE — Telephone Encounter (Signed)
Faxed request for medical records to [facility] Dr. Earlean Shawl 08/29/18

## 2018-09-06 DIAGNOSIS — M7731 Calcaneal spur, right foot: Secondary | ICD-10-CM | POA: Diagnosis not present

## 2018-09-08 DIAGNOSIS — M25551 Pain in right hip: Secondary | ICD-10-CM | POA: Diagnosis not present

## 2018-09-12 DIAGNOSIS — M7731 Calcaneal spur, right foot: Secondary | ICD-10-CM | POA: Diagnosis not present

## 2018-09-15 ENCOUNTER — Telehealth: Payer: Self-pay | Admitting: Genetic Counselor

## 2018-09-15 NOTE — Telephone Encounter (Signed)
Called patient regarding upcoming [facility] appointment, patient is notified. Patient will also be coming in for labs.

## 2018-09-16 ENCOUNTER — Other Ambulatory Visit: Payer: Self-pay | Admitting: Genetic Counselor

## 2018-09-16 DIAGNOSIS — Z86010 Personal history of colonic polyps: Secondary | ICD-10-CM

## 2018-09-19 ENCOUNTER — Inpatient Hospital Stay: Payer: BC Managed Care – PPO

## 2018-09-19 ENCOUNTER — Other Ambulatory Visit: Payer: Self-pay

## 2018-09-19 ENCOUNTER — Inpatient Hospital Stay: Payer: BC Managed Care – PPO | Attending: Genetic Counselor | Admitting: Genetic Counselor

## 2018-09-19 ENCOUNTER — Encounter: Payer: Self-pay | Admitting: Genetic Counselor

## 2018-09-19 DIAGNOSIS — Z860100 Personal history of colon polyps, unspecified: Secondary | ICD-10-CM

## 2018-09-19 DIAGNOSIS — M7731 Calcaneal spur, right foot: Secondary | ICD-10-CM | POA: Diagnosis not present

## 2018-09-19 DIAGNOSIS — Z1379 Encounter for other screening for genetic and chromosomal anomalies: Secondary | ICD-10-CM

## 2018-09-19 DIAGNOSIS — Z8371 Family history of colonic polyps: Secondary | ICD-10-CM | POA: Diagnosis not present

## 2018-09-19 DIAGNOSIS — Z83719 Family history of colon polyps, unspecified: Secondary | ICD-10-CM

## 2018-09-19 NOTE — Progress Notes (Signed)
REFERRING PROVIDER: Jerene Bears, MD [address],  [place] [address]  PRIMARY PROVIDER:  Shon Baton, MD  PRIMARY REASON FOR VISIT:  1. Personal history of colonic polyps   2. Family history of colonic polyps      HISTORY OF PRESENT ILLNESS:   I connected with Christy Porter on 09/19/2018 at 9 AM EDT by Webex video conference and verified that I am speaking with the correct person using two identifiers.   Patient location: Home Provider location: Office  Christy Porter, a 48 y.o. female, was seen for a [place] cancer genetics consultation at the request of Dr. Hilarie Fredrickson due to a personal and family history of colon polyps.  Christy Porter presents to clinic today to discuss the possibility of a hereditary predisposition to cancer, genetic testing, and to further clarify her future cancer risks, as well as potential cancer risks for family members.   In 2012, at the age of 6, Christy Porter was diagnosed with three tubular adenomas on colonoscopy.  She had a colonoscopy 3 years later that was clear, and then again in another 5 years that was clear.  Both siblings have had colonoscopies and found colon polyps and her father has polyposis.     CANCER HISTORY:  Oncology History   No history exists.     RISK FACTORS:  Menarche was at age 38.  First live birth at age 23.  Ovaries intact: yes.  Hysterectomy: no.  Menopausal status: perimenopausal.  HRT use: 0 years. Colonoscopy: yes; 3 polyps. Mammogram within the last year: yes. Number of breast biopsies: 0. Up to date with pelvic exams: yes. Any excessive radiation exposure in the past: no  Past Medical History:  Diagnosis Date  . Colon polyps   . Family history of colonic polyps   . HELLP syndrome   . Hypertension   . Migraine without aura, without mention of intractable migraine without mention of status migrainosus 02/13/2013  . Personal history of colonic polyps   . PONV (postoperative nausea and vomiting)   . Vitamin D  deficiency     Past Surgical History:  Procedure Laterality Date  . BREAST BIOPSY Left 09/2014  . BREAST EXCISIONAL BIOPSY Left 11/2014  . BREAST LUMPECTOMY WITH RADIOACTIVE SEED LOCALIZATION Left 12/07/2014   Procedure: LEFT BREAST LUMPECTOMY WITH RADIOACTIVE SEED LOCALIZATION AND LEFT CENTRAL DUCT EXCISION;  Surgeon: Erroll Luna, MD;  Location: [facility];  Service: General;  Laterality: Left;  . BREAST REDUCTION SURGERY  2000  . CESAREAN SECTION  '08  . perianal skin excision    . vaginal ablation      Social History   Socioeconomic History  . Marital status: Married    Spouse name: Leory Plowman  . Number of children: 2  . Years of education: College  . Highest education level: Not on file  Occupational History    Employer: [employer]    Comment: Pt. works for [employer]  . Financial resource strain: Not on file  . Food insecurity    Worry: Not on file    Inability: Not on file  . Transportation needs    Medical: Not on file    Non-medical: Not on file  Tobacco Use  . Smoking status: Never Smoker  . Smokeless tobacco: Never Used  Substance and Sexual Activity  . Alcohol use: Yes    Comment: Drinks alcohol on occasion  . Drug use: No  . Sexual activity: Not on file  Lifestyle  . Physical activity    Days per week: Not on file    Minutes per session: Not on file  . Stress: Not on file  Relationships  . Social Herbalist on phone: Not on file    Gets together: Not on file    Attends religious service: Not on file    Active member of club or organization: Not on file    Attends meetings of clubs or organizations: Not on file    Relationship status: Not on file  Other Topics Concern  . Not on file  Social History Narrative   Works as a Doctor, hospital).  Lives with husband and two children.   Caffeine Use: 3-4 Diet Cokes daily     FAMILY HISTORY:  We obtained a detailed, 4-generation family history.   Significant diagnoses are listed below: Family History  Problem Relation Age of Onset  . Hypertension Mother        Living  . Migraines Mother   . Irritable bowel syndrome Mother   . Lupus Maternal Grandmother   . Healthy Father        Living  . Colon polyps Father        between 5-20 polyps  . Colon polyps Sister        3-5 polyps  . Irritable bowel syndrome Sister   . Colon polyps Brother        7 polyps  . Prostate cancer Maternal Grandfather 26  . Colon cancer Neg Hx   . Esophageal cancer Neg Hx   . Stomach cancer Neg Hx   . Pancreatic cancer Neg Hx   . Liver disease Neg Hx     The patient has a son and daughter who are cancer free.  She has a brother and sister who are cancer free, but both have had colon polyps.  Her brother had colon polyps in his 55's and her sister had colon polyps in her 29's.  Both parents are living.  The patient's mother is living at 81.  She has a brother and sister who are cancer free.  It is not known if they have colon polyps.  The maternal grandparents are deceased.  The grandmother died of old age, and the grandfather also died of old age, but he had prostate cancer at 67.  The patient's father has polyposis with between 10-20 polyps.  He has a sister who is cancer free, but it is not known if she has polyps.  The paternal grandparents are deceased from old age.  Christy Porter is unaware of previous family history of genetic testing for hereditary cancer risks. Patient's maternal ancestors are of [place] descent, and paternal ancestors are of [place] descent. There is no reported Ashkenazi Jewish ancestry. There is no known consanguinity.  GENETIC COUNSELING ASSESSMENT: Christy Porter is a 48 y.o. female with a personal and family history of colon polyps which is somewhat suggestive of a hereditary polyposis/colon cancer syndrome and predisposition to cancer. We, therefore, discussed and recommended the following at today's visit.   DISCUSSION: We  discussed that colon polyps are common, with most people having 1-2 polyps.  When an individual has polyps at a young age, or if they have over 10 polyps in their lifetime, they are at risk for a hereditary colon polyp/cancer syndrome.    We reviewed the characteristics, features and inheritance patterns of hereditary cancer syndromes. We also discussed genetic testing, including the appropriate family members to test,  the process of testing, insurance coverage and turn-around-time for results. We discussed the implications of a negative, positive and/or variant of uncertain significant result. We recommended Christy Porter pursue genetic testing for the common hereditary cancer gene panel. The Common Hereditary Gene Panel offered by [facility] includes sequencing and/or deletion duplication testing of the following 48 genes: APC, ATM, AXIN2, BARD1, BMPR1A, BRCA1, BRCA2, BRIP1, CDH1, CDK4, CDKN2A (p14ARF), CDKN2A (p16INK4a), CHEK2, CTNNA1, DICER1, EPCAM (Deletion/duplication testing only), GREM1 (promoter region deletion/duplication testing only), KIT, MEN1, MLH1, MSH2, MSH3, MSH6, MUTYH, NBN, NF1, NHTL1, PALB2, PDGFRA, PMS2, POLD1, POLE, PTEN, RAD50, RAD51C, RAD51D, RNF43, SDHB, SDHC, SDHD, SMAD4, SMARCA4. STK11, TP53, TSC1, TSC2, and VHL.  The following genes were evaluated for sequence changes only: SDHA and HOXB13 c.251G>A variant only.   We discussed that traditional criteria for genetic testing does not look at the age of onset of colon polyps, but instead the number of polyps.  That criteria starts with 10 polyps.  Based on this information, she does not meet strict criteria for genetic testing, but with her early age of onset and her siblings early age of onset of polyps, along with her father's polyposis, we can have a benefits investigation performed.  The most she would pay out of pocket is the patient pay, of $250.  PLAN: After considering the risks, benefits, and limitations, Christy Porter provided informed  consent to pursue genetic testing and the blood sample was sent to [facility] for analysis of the common hereditary cancer panel. Results should be available within approximately 2-3 weeks' time, at which point they will be disclosed by telephone to Christy Porter, as will any additional recommendations warranted by these results. Christy Porter will receive a summary of her genetic counseling visit and a copy of her results once available. This information will also be available in Epic.   Lastly, we encouraged Christy Porter to remain in contact with cancer genetics annually so that we can continuously update the family history and inform her of any changes in cancer genetics and testing that may be of benefit for this family.   Christy Porter questions were answered to her satisfaction today. Our contact information was provided should additional questions or concerns arise. Thank you for the referral and allowing [place] to share in the care of your patient.   Aidden Markovic P. Florene Porter, [place], [facility] Certified Genetic Counselor [email or web address] phone: [phone]  The patient was seen for a total of 45 minutes in face-to-face genetic counseling.  This patient was discussed with Drs. Magrinat, Lindi Adie and/or Burr Medico who agrees with the above.    _______________________________________________________________________ For Office Staff:  Number of people involved in session: 1 Was an Intern/ student involved with case: no

## 2018-09-26 DIAGNOSIS — M7731 Calcaneal spur, right foot: Secondary | ICD-10-CM | POA: Diagnosis not present

## 2018-10-03 DIAGNOSIS — M7731 Calcaneal spur, right foot: Secondary | ICD-10-CM | POA: Diagnosis not present

## 2018-10-04 DIAGNOSIS — H5213 Myopia, bilateral: Secondary | ICD-10-CM | POA: Diagnosis not present

## 2018-10-07 ENCOUNTER — Encounter: Payer: Self-pay | Admitting: Genetic Counselor

## 2018-10-07 ENCOUNTER — Telehealth: Payer: Self-pay | Admitting: Genetic Counselor

## 2018-10-07 DIAGNOSIS — Z1379 Encounter for other screening for genetic and chromosomal anomalies: Secondary | ICD-10-CM | POA: Insufficient documentation

## 2018-10-07 NOTE — Telephone Encounter (Signed)
LM on VM that results are back and to please call. 

## 2018-10-10 DIAGNOSIS — M7731 Calcaneal spur, right foot: Secondary | ICD-10-CM | POA: Diagnosis not present

## 2018-10-10 NOTE — Telephone Encounter (Signed)
LM on VM that results are back and to please call. 

## 2018-10-11 ENCOUNTER — Ambulatory Visit: Payer: Self-pay | Admitting: Genetic Counselor

## 2018-10-11 DIAGNOSIS — Z1379 Encounter for other screening for genetic and chromosomal anomalies: Secondary | ICD-10-CM

## 2018-10-11 NOTE — Telephone Encounter (Signed)
Revealed negative genetic testing.  Discussed that we do not know why she has colon polyps or why there is polyps in the family. It could be due to a different gene that we are not testing, or maybe our current technology may not be able to pick something up.  It will be important for her to keep in contact with genetics to keep up with whether additional testing may be needed.

## 2018-10-11 NOTE — Progress Notes (Signed)
HPI:  [name] was previously seen in the [facility] due to a personal and family history of colon polyps and concerns regarding a hereditary predisposition to cancer. Please refer to our prior cancer genetics clinic note for more information regarding our discussion, assessment and recommendations, at the time. [name] recent genetic test results were disclosed to her, as were recommendations warranted by these results. These results and recommendations are discussed in more detail below.  CANCER HISTORY:  Oncology History   No history exists.    FAMILY HISTORY:  We obtained a detailed, 4-generation family history.  Significant diagnoses are listed below: Family History  Problem Relation Age of Onset  . Hypertension Mother        Living  . Migraines Mother   . Irritable bowel syndrome Mother   . Lupus Maternal Grandmother   . Healthy Father        Living  . Colon polyps Father        between 55-20 polyps  . Colon polyps Sister        3-5 polyps  . Irritable bowel syndrome Sister   . Colon polyps Brother        7 polyps  . Prostate cancer Maternal Grandfather 59  . Colon cancer Neg Hx   . Esophageal cancer Neg Hx   . Stomach cancer Neg Hx   . Pancreatic cancer Neg Hx   . Liver disease Neg Hx     The patient has a son and daughter who are cancer free.  She has a brother and sister who are cancer free, but both have had colon polyps.  Her brother had colon polyps in his 17's and her sister had colon polyps in her 62's.  Both parents are living.  The patient's mother is living at 39.  She has a brother and sister who are cancer free.  It is not known if they have colon polyps.  The maternal grandparents are deceased.  The grandmother died of old age, and the grandfather also died of old age, but he had prostate cancer at 56.  The patient's father has polyposis with between 10-20 polyps.  He has a sister who is cancer free, but it is not known if she has  polyps.  The paternal grandparents are deceased from old age.  [name] is unaware of previous family history of genetic testing for hereditary cancer risks. Patient's maternal ancestors are of [place] descent, and paternal ancestors are of [place] descent. There is no reported Ashkenazi Jewish ancestry. There is no known consanguinity.    GENETIC TEST RESULTS: Genetic testing reported out on October 06, 2018 through the common hereditary cancer panel and additional 10 genes associated with preliminary evidence of colon cancer panel found no pathogenic mutations. The Custom Hereditary Gene Panel offered by [facility] includes sequencing and/or deletion duplication testing of the following 56 genes: APC, ATM, AXIN2, BARD1, BLM, BMPR1A, BRCA1, BRCA2, BRIP1, BUB1B, CDH1, CDK4, CDKN2A (p14ARF), CDKN2A (p16INK4a), CEP57, CHEK2, CTNNA1, DICER1, ENG, EPCAM (Deletion/duplication testing only), FLCN, GALNT12, GREM1 (promoter region deletion/duplication testing only), KIT, MEN1, MLH1, MLH3, MSH2, MSH3, MSH6, MUTYH, NBN, NF1, NHTL1, PALB2, PDGFRA, PMS2, POLD1, POLE, PTEN, RAD50, RAD51C, RAD51D, RNF43, RPS20, SDHB, SDHC, SDHD, SMAD4, SMARCA4. STK11, TP53, TSC1, TSC2, and VHL.  The following genes were evaluated for sequence changes only: SDHA and HOXB13 c.251G>A variant only. The test report has been scanned into EPIC and is located under the Molecular Pathology section of the Results Review  tab.  A portion of the result report is included below for reference.     We discussed with [name] that because current genetic testing is not perfect, it is possible there may be a gene mutation in one of these genes that current testing cannot detect, but that chance is small.  We also discussed, that there could be another gene that has not yet been discovered, or that we have not yet tested, that is responsible for the cancer diagnoses in the family. It is also possible there is a hereditary cause for the cancer in the family  that [name] did not inherit and therefore was not identified in her testing.  Therefore, it is important to remain in touch with cancer genetics in the future so that we can continue to offer [name] the most up to date genetic testing.   Genetic testing did identify two Variants of uncertain significance (VUS) - one in the PALB2 gene called c.3106G>C, and a second in the Endeavor gene called c.914G>A.  At this time, it is unknown if these variants are associated with increased cancer risk or if they are normal findings, but most variants such as these get reclassified to being inconsequential. They should not be used to make medical management decisions. With time, we suspect the lab will determine the significance of these variants, if any. If we do learn more about them, we will try to contact [name] to discuss it further. However, it is important to stay in touch with [place] periodically and keep the address and phone number up to date.  ADDITIONAL GENETIC TESTING: We discussed with [name] that there are other genes that are associated with increased cancer risk that can be analyzed. Should [name] wish to pursue additional genetic testing, we are happy to discuss and coordinate this testing, at any time.    CANCER SCREENING RECOMMENDATIONS: [name] test result is considered negative (normal).  This means that we have not identified a hereditary cause for her personal and family history of colon polyps at this time. Most cancers happen by chance and this negative test suggests that her cancer may fall into this category.    While reassuring, this does not definitively rule out a hereditary predisposition to cancer. It is still possible that there could be genetic mutations that are undetectable by current technology. There could be genetic mutations in genes that have not been tested or identified to increase cancer risk.  Therefore, it is recommended she continue to follow the cancer management and  screening guidelines provided by her and primary healthcare provider.   An individual's cancer risk and medical management are not determined by genetic test results alone. Overall cancer risk assessment incorporates additional factors, including personal medical history, family history, and any available genetic information that may result in a personalized plan for cancer prevention and surveillance  This negative genetic test simply tells [place] that we cannot yet define why [name] has had colorectal polyps at a young age, or why her family members have early onset colon polyps and polyposis. [name]'s medical management and screening should be based on the prospect that she will likely form more colon polyps in the future and should, therefore, undergo more frequent colonoscopy screening at intervals determined by her GI providers.  We also recommended that [name] have an upper endoscopy periodically.  RECOMMENDATIONS FOR FAMILY MEMBERS:  Individuals in this family might be at some increased risk of developing cancer,  over the general population risk, simply due to the family history of cancer.  We recommended women in this family have a yearly mammogram beginning at age 63, or 93 years younger than the earliest onset of cancer, an annual clinical breast exam, and perform monthly breast self-exams. Women in this family should also have a gynecological exam as recommended by their primary provider. All family members should have a colonoscopy by age 58.  It is also possible there is a hereditary cause for the cancer in [name]'s family that she did not inherit and therefore was not identified in her.  Based on [name]'s family history, we recommended her father, who was diagnosed with polyposis, have genetic counseling and testing. [name] will let us know if we can be of any assistance in coordinating genetic counseling and/or testing for this family member.   FOLLOW-UP: Lastly, we discussed with  [name] that cancer genetics is a rapidly advancing field and it is possible that new genetic tests will be appropriate for her and/or her family members in the future. We encouraged her to remain in contact with cancer genetics on an annual basis so we can update her personal and family histories and let her know of advances in cancer genetics that may benefit this family.   Our contact number was provided. [name] questions were answered to her satisfaction, and she knows she is welcome to call us at anytime with additional questions or concerns.   Roma Kayser, [place], [facility] Licensed, Certified Genetic Counselor [email or web address]

## 2018-10-20 DIAGNOSIS — R5383 Other fatigue: Secondary | ICD-10-CM | POA: Diagnosis not present

## 2018-10-20 DIAGNOSIS — Z0000 Encounter for general adult medical examination without abnormal findings: Secondary | ICD-10-CM | POA: Diagnosis not present

## 2018-10-20 DIAGNOSIS — E559 Vitamin D deficiency, unspecified: Secondary | ICD-10-CM | POA: Diagnosis not present

## 2018-10-24 DIAGNOSIS — R82998 Other abnormal findings in urine: Secondary | ICD-10-CM | POA: Diagnosis not present

## 2018-10-25 DIAGNOSIS — E063 Autoimmune thyroiditis: Secondary | ICD-10-CM | POA: Diagnosis not present

## 2018-10-25 DIAGNOSIS — Z8632 Personal history of gestational diabetes: Secondary | ICD-10-CM | POA: Diagnosis not present

## 2018-10-27 DIAGNOSIS — Z0000 Encounter for general adult medical examination without abnormal findings: Secondary | ICD-10-CM | POA: Diagnosis not present

## 2018-10-27 DIAGNOSIS — Z1331 Encounter for screening for depression: Secondary | ICD-10-CM | POA: Diagnosis not present

## 2018-10-27 DIAGNOSIS — K219 Gastro-esophageal reflux disease without esophagitis: Secondary | ICD-10-CM | POA: Diagnosis not present

## 2018-10-27 DIAGNOSIS — I809 Phlebitis and thrombophlebitis of unspecified site: Secondary | ICD-10-CM | POA: Diagnosis not present

## 2018-10-27 DIAGNOSIS — G43909 Migraine, unspecified, not intractable, without status migrainosus: Secondary | ICD-10-CM | POA: Diagnosis not present

## 2018-10-27 DIAGNOSIS — H9202 Otalgia, left ear: Secondary | ICD-10-CM | POA: Diagnosis not present

## 2018-11-09 DIAGNOSIS — E063 Autoimmune thyroiditis: Secondary | ICD-10-CM | POA: Diagnosis not present

## 2018-11-24 DIAGNOSIS — M7731 Calcaneal spur, right foot: Secondary | ICD-10-CM | POA: Diagnosis not present

## 2018-12-01 DIAGNOSIS — M7731 Calcaneal spur, right foot: Secondary | ICD-10-CM | POA: Diagnosis not present

## 2018-12-03 DIAGNOSIS — Z23 Encounter for immunization: Secondary | ICD-10-CM | POA: Diagnosis not present

## 2018-12-05 DIAGNOSIS — L578 Other skin changes due to chronic exposure to nonionizing radiation: Secondary | ICD-10-CM | POA: Diagnosis not present

## 2018-12-05 DIAGNOSIS — D225 Melanocytic nevi of trunk: Secondary | ICD-10-CM | POA: Diagnosis not present

## 2018-12-05 DIAGNOSIS — L718 Other rosacea: Secondary | ICD-10-CM | POA: Diagnosis not present

## 2018-12-05 DIAGNOSIS — L814 Other melanin hyperpigmentation: Secondary | ICD-10-CM | POA: Diagnosis not present

## 2018-12-07 DIAGNOSIS — M25551 Pain in right hip: Secondary | ICD-10-CM | POA: Diagnosis not present

## 2018-12-07 DIAGNOSIS — M7061 Trochanteric bursitis, right hip: Secondary | ICD-10-CM | POA: Diagnosis not present

## 2018-12-09 DIAGNOSIS — M7731 Calcaneal spur, right foot: Secondary | ICD-10-CM | POA: Diagnosis not present

## 2018-12-21 DIAGNOSIS — M25551 Pain in right hip: Secondary | ICD-10-CM | POA: Diagnosis not present

## 2018-12-27 DIAGNOSIS — M25551 Pain in right hip: Secondary | ICD-10-CM | POA: Diagnosis not present

## 2019-01-10 DIAGNOSIS — M25551 Pain in right hip: Secondary | ICD-10-CM | POA: Diagnosis not present

## 2019-01-25 DIAGNOSIS — M25551 Pain in right hip: Secondary | ICD-10-CM | POA: Diagnosis not present

## 2019-02-02 DIAGNOSIS — J029 Acute pharyngitis, unspecified: Secondary | ICD-10-CM | POA: Diagnosis not present

## 2019-02-02 DIAGNOSIS — R070 Pain in throat: Secondary | ICD-10-CM | POA: Diagnosis not present

## 2019-02-08 ENCOUNTER — Other Ambulatory Visit: Payer: Self-pay | Admitting: Obstetrics and Gynecology

## 2019-02-08 DIAGNOSIS — Z1231 Encounter for screening mammogram for malignant neoplasm of breast: Secondary | ICD-10-CM

## 2019-02-08 DIAGNOSIS — M25551 Pain in right hip: Secondary | ICD-10-CM | POA: Diagnosis not present

## 2019-02-23 DIAGNOSIS — M25551 Pain in right hip: Secondary | ICD-10-CM | POA: Diagnosis not present

## 2019-03-13 DIAGNOSIS — Z01419 Encounter for gynecological examination (general) (routine) without abnormal findings: Secondary | ICD-10-CM | POA: Diagnosis not present

## 2019-03-13 DIAGNOSIS — N939 Abnormal uterine and vaginal bleeding, unspecified: Secondary | ICD-10-CM | POA: Diagnosis not present

## 2019-03-13 DIAGNOSIS — Z681 Body mass index (BMI) 19 or less, adult: Secondary | ICD-10-CM | POA: Diagnosis not present

## 2019-04-03 ENCOUNTER — Other Ambulatory Visit: Payer: Self-pay

## 2019-04-03 ENCOUNTER — Ambulatory Visit
Admission: RE | Admit: 2019-04-03 | Discharge: 2019-04-03 | Disposition: A | Discharge status: Home / Self Care | Payer: BC Managed Care – PPO | Source: Ambulatory Visit | Attending: Obstetrics and Gynecology | Admitting: Obstetrics and Gynecology

## 2019-04-03 DIAGNOSIS — Z1231 Encounter for screening mammogram for malignant neoplasm of breast: Secondary | ICD-10-CM

## 2019-04-05 DIAGNOSIS — M25551 Pain in right hip: Secondary | ICD-10-CM | POA: Diagnosis not present

## 2019-04-12 DIAGNOSIS — M25551 Pain in right hip: Secondary | ICD-10-CM | POA: Diagnosis not present

## 2019-04-13 DIAGNOSIS — N390 Urinary tract infection, site not specified: Secondary | ICD-10-CM | POA: Diagnosis not present

## 2019-04-13 DIAGNOSIS — Z0000 Encounter for general adult medical examination without abnormal findings: Secondary | ICD-10-CM | POA: Diagnosis not present

## 2019-04-19 DIAGNOSIS — M25551 Pain in right hip: Secondary | ICD-10-CM | POA: Diagnosis not present

## 2019-05-09 DIAGNOSIS — R300 Dysuria: Secondary | ICD-10-CM | POA: Diagnosis not present

## 2019-05-16 DIAGNOSIS — Z111 Encounter for screening for respiratory tuberculosis: Secondary | ICD-10-CM | POA: Diagnosis not present

## 2019-05-18 ENCOUNTER — Ambulatory Visit: Payer: BC Managed Care – PPO | Attending: Internal Medicine

## 2019-05-18 DIAGNOSIS — M25551 Pain in right hip: Secondary | ICD-10-CM | POA: Diagnosis not present

## 2019-05-18 DIAGNOSIS — Z23 Encounter for immunization: Secondary | ICD-10-CM | POA: Insufficient documentation

## 2019-05-18 NOTE — Progress Notes (Signed)
   Covid-19 Vaccination Clinic  Name:  Christy Porter    MRN: [id number] DOB: [birth year]  05/18/2019  [name] was observed post Covid-19 immunization for 15 minutes without incidence. She was provided with Vaccine Information Sheet and instruction to access the V-Safe system.   [name] was instructed to call 911 with any severe reactions post vaccine: Marland Kitchen Difficulty breathing  . Swelling of your face and throat  . A fast heartbeat  . A bad rash all over your body  . Dizziness and weakness    Immunizations Administered    Name Date Dose VIS Date Route   Pfizer COVID-19 Vaccine 05/18/2019  8:11 AM 0.3 mL 03/03/2019 Intramuscular   Manufacturer: [place]   Lot: [id number]   [place]: [id number]

## 2019-06-07 ENCOUNTER — Ambulatory Visit: Payer: BC Managed Care – PPO | Attending: Internal Medicine

## 2019-06-07 DIAGNOSIS — Z23 Encounter for immunization: Secondary | ICD-10-CM

## 2019-06-07 NOTE — Progress Notes (Signed)
   Covid-19 Vaccination Clinic  Name:  SANIJAH ESTELLE    MRN: [id number] DOB: 19-Jul-1970  06/07/2019  Ms. Virk was observed post Covid-19 immunization for 15 minutes without incident. She was provided with Vaccine Information Sheet and instruction to access the V-Safe system.   Ms. Chmelik was instructed to call 911 with any severe reactions post vaccine: Marland Kitchen Difficulty breathing  . Swelling of face and throat  . A fast heartbeat  . A bad rash all over body  . Dizziness and weakness   Immunizations Administered    Name Date Dose VIS Date Route   Pfizer COVID-19 Vaccine 06/07/2019  3:48 PM 0.3 mL 03/03/2019 Intramuscular   Manufacturer: [place]   Lot: [id number]   [place]: [id number]

## 2019-06-20 DIAGNOSIS — M25551 Pain in right hip: Secondary | ICD-10-CM | POA: Diagnosis not present

## 2019-07-04 DIAGNOSIS — M25551 Pain in right hip: Secondary | ICD-10-CM | POA: Diagnosis not present

## 2019-07-06 DIAGNOSIS — R0602 Shortness of breath: Secondary | ICD-10-CM | POA: Diagnosis not present

## 2019-07-06 DIAGNOSIS — K219 Gastro-esophageal reflux disease without esophagitis: Secondary | ICD-10-CM | POA: Diagnosis not present

## 2019-07-13 DIAGNOSIS — M25551 Pain in right hip: Secondary | ICD-10-CM | POA: Diagnosis not present

## 2019-07-21 DIAGNOSIS — M25551 Pain in right hip: Secondary | ICD-10-CM | POA: Diagnosis not present

## 2019-08-16 DIAGNOSIS — M25551 Pain in right hip: Secondary | ICD-10-CM | POA: Diagnosis not present

## 2019-08-23 DIAGNOSIS — M25551 Pain in right hip: Secondary | ICD-10-CM | POA: Diagnosis not present

## 2019-08-31 DIAGNOSIS — M25551 Pain in right hip: Secondary | ICD-10-CM | POA: Diagnosis not present

## 2019-10-23 DIAGNOSIS — E559 Vitamin D deficiency, unspecified: Secondary | ICD-10-CM | POA: Diagnosis not present

## 2019-10-23 DIAGNOSIS — Z0000 Encounter for general adult medical examination without abnormal findings: Secondary | ICD-10-CM | POA: Diagnosis not present

## 2019-10-23 DIAGNOSIS — R7989 Other specified abnormal findings of blood chemistry: Secondary | ICD-10-CM | POA: Diagnosis not present

## 2019-10-30 DIAGNOSIS — E559 Vitamin D deficiency, unspecified: Secondary | ICD-10-CM | POA: Diagnosis not present

## 2019-10-30 DIAGNOSIS — R82998 Other abnormal findings in urine: Secondary | ICD-10-CM | POA: Diagnosis not present

## 2019-10-30 DIAGNOSIS — Z0000 Encounter for general adult medical examination without abnormal findings: Secondary | ICD-10-CM | POA: Diagnosis not present

## 2019-10-31 DIAGNOSIS — E063 Autoimmune thyroiditis: Secondary | ICD-10-CM | POA: Diagnosis not present

## 2019-10-31 DIAGNOSIS — Z8632 Personal history of gestational diabetes: Secondary | ICD-10-CM | POA: Diagnosis not present

## 2019-11-02 DIAGNOSIS — M25551 Pain in right hip: Secondary | ICD-10-CM | POA: Diagnosis not present

## 2019-11-06 DIAGNOSIS — H5213 Myopia, bilateral: Secondary | ICD-10-CM | POA: Diagnosis not present

## 2019-12-05 DIAGNOSIS — D225 Melanocytic nevi of trunk: Secondary | ICD-10-CM | POA: Diagnosis not present

## 2019-12-05 DIAGNOSIS — L814 Other melanin hyperpigmentation: Secondary | ICD-10-CM | POA: Diagnosis not present

## 2019-12-05 DIAGNOSIS — D2262 Melanocytic nevi of left upper limb, including shoulder: Secondary | ICD-10-CM | POA: Diagnosis not present

## 2019-12-05 DIAGNOSIS — L821 Other seborrheic keratosis: Secondary | ICD-10-CM | POA: Diagnosis not present

## 2019-12-27 DIAGNOSIS — Z23 Encounter for immunization: Secondary | ICD-10-CM | POA: Diagnosis not present

## 2020-02-23 DIAGNOSIS — M25551 Pain in right hip: Secondary | ICD-10-CM | POA: Diagnosis not present

## 2020-03-11 DIAGNOSIS — G4486 Cervicogenic headache: Secondary | ICD-10-CM | POA: Diagnosis not present

## 2020-03-11 DIAGNOSIS — M25551 Pain in right hip: Secondary | ICD-10-CM | POA: Diagnosis not present

## 2020-03-13 ENCOUNTER — Other Ambulatory Visit: Payer: Self-pay | Admitting: Obstetrics and Gynecology

## 2020-03-13 DIAGNOSIS — Z1231 Encounter for screening mammogram for malignant neoplasm of breast: Secondary | ICD-10-CM

## 2020-03-14 DIAGNOSIS — G4486 Cervicogenic headache: Secondary | ICD-10-CM | POA: Diagnosis not present

## 2020-03-14 DIAGNOSIS — M25551 Pain in right hip: Secondary | ICD-10-CM | POA: Diagnosis not present

## 2020-03-25 DIAGNOSIS — M542 Cervicalgia: Secondary | ICD-10-CM | POA: Diagnosis not present

## 2020-04-01 DIAGNOSIS — Z681 Body mass index (BMI) 19 or less, adult: Secondary | ICD-10-CM | POA: Diagnosis not present

## 2020-04-01 DIAGNOSIS — Z01419 Encounter for gynecological examination (general) (routine) without abnormal findings: Secondary | ICD-10-CM | POA: Diagnosis not present

## 2020-04-01 DIAGNOSIS — I10 Essential (primary) hypertension: Secondary | ICD-10-CM | POA: Diagnosis not present

## 2020-04-01 DIAGNOSIS — Z1152 Encounter for screening for COVID-19: Secondary | ICD-10-CM | POA: Diagnosis not present

## 2020-04-02 DIAGNOSIS — G4486 Cervicogenic headache: Secondary | ICD-10-CM | POA: Diagnosis not present

## 2020-04-02 DIAGNOSIS — M25551 Pain in right hip: Secondary | ICD-10-CM | POA: Diagnosis not present

## 2020-04-15 DIAGNOSIS — Z1159 Encounter for screening for other viral diseases: Secondary | ICD-10-CM | POA: Diagnosis not present

## 2020-04-22 ENCOUNTER — Ambulatory Visit: Payer: BC Managed Care – PPO

## 2020-04-22 DIAGNOSIS — N870 Mild cervical dysplasia: Secondary | ICD-10-CM | POA: Diagnosis not present

## 2020-04-22 DIAGNOSIS — R87610 Atypical squamous cells of undetermined significance on cytologic smear of cervix (ASC-US): Secondary | ICD-10-CM | POA: Diagnosis not present

## 2020-05-06 ENCOUNTER — Ambulatory Visit: Payer: BC Managed Care – PPO

## 2020-05-14 DIAGNOSIS — Z111 Encounter for screening for respiratory tuberculosis: Secondary | ICD-10-CM | POA: Diagnosis not present

## 2020-05-30 DIAGNOSIS — G4486 Cervicogenic headache: Secondary | ICD-10-CM | POA: Diagnosis not present

## 2020-05-30 DIAGNOSIS — M25551 Pain in right hip: Secondary | ICD-10-CM | POA: Diagnosis not present

## 2020-06-24 ENCOUNTER — Other Ambulatory Visit: Payer: Self-pay

## 2020-06-24 ENCOUNTER — Ambulatory Visit
Admission: RE | Admit: 2020-06-24 | Discharge: 2020-06-24 | Disposition: A | Discharge status: Home / Self Care | Payer: BC Managed Care – PPO | Source: Ambulatory Visit | Attending: Obstetrics and Gynecology | Admitting: Obstetrics and Gynecology

## 2020-06-24 DIAGNOSIS — Z1231 Encounter for screening mammogram for malignant neoplasm of breast: Secondary | ICD-10-CM

## 2020-08-29 DIAGNOSIS — M25551 Pain in right hip: Secondary | ICD-10-CM | POA: Diagnosis not present

## 2020-08-29 DIAGNOSIS — G4486 Cervicogenic headache: Secondary | ICD-10-CM | POA: Diagnosis not present

## 2020-09-05 DIAGNOSIS — M25551 Pain in right hip: Secondary | ICD-10-CM | POA: Diagnosis not present

## 2020-09-05 DIAGNOSIS — G4486 Cervicogenic headache: Secondary | ICD-10-CM | POA: Diagnosis not present

## 2020-09-11 DIAGNOSIS — M25551 Pain in right hip: Secondary | ICD-10-CM | POA: Diagnosis not present

## 2020-09-11 DIAGNOSIS — G4486 Cervicogenic headache: Secondary | ICD-10-CM | POA: Diagnosis not present

## 2020-09-20 DIAGNOSIS — M25551 Pain in right hip: Secondary | ICD-10-CM | POA: Diagnosis not present

## 2020-09-20 DIAGNOSIS — G4486 Cervicogenic headache: Secondary | ICD-10-CM | POA: Diagnosis not present

## 2020-10-22 DIAGNOSIS — R87610 Atypical squamous cells of undetermined significance on cytologic smear of cervix (ASC-US): Secondary | ICD-10-CM | POA: Diagnosis not present

## 2020-10-28 DIAGNOSIS — E063 Autoimmune thyroiditis: Secondary | ICD-10-CM | POA: Diagnosis not present

## 2020-10-28 DIAGNOSIS — Z131 Encounter for screening for diabetes mellitus: Secondary | ICD-10-CM | POA: Diagnosis not present

## 2020-10-28 DIAGNOSIS — Z8632 Personal history of gestational diabetes: Secondary | ICD-10-CM | POA: Diagnosis not present

## 2020-10-29 DIAGNOSIS — E559 Vitamin D deficiency, unspecified: Secondary | ICD-10-CM | POA: Diagnosis not present

## 2020-10-29 DIAGNOSIS — Z0000 Encounter for general adult medical examination without abnormal findings: Secondary | ICD-10-CM | POA: Diagnosis not present

## 2020-10-29 DIAGNOSIS — Z79899 Other long term (current) drug therapy: Secondary | ICD-10-CM | POA: Diagnosis not present

## 2020-11-01 DIAGNOSIS — Z8632 Personal history of gestational diabetes: Secondary | ICD-10-CM | POA: Diagnosis not present

## 2020-11-01 DIAGNOSIS — E063 Autoimmune thyroiditis: Secondary | ICD-10-CM | POA: Diagnosis not present

## 2020-11-05 DIAGNOSIS — Z0000 Encounter for general adult medical examination without abnormal findings: Secondary | ICD-10-CM | POA: Diagnosis not present

## 2020-11-05 DIAGNOSIS — D649 Anemia, unspecified: Secondary | ICD-10-CM | POA: Diagnosis not present

## 2020-11-05 DIAGNOSIS — E559 Vitamin D deficiency, unspecified: Secondary | ICD-10-CM | POA: Diagnosis not present

## 2020-11-05 DIAGNOSIS — E876 Hypokalemia: Secondary | ICD-10-CM | POA: Diagnosis not present

## 2020-11-05 DIAGNOSIS — R82998 Other abnormal findings in urine: Secondary | ICD-10-CM | POA: Diagnosis not present

## 2020-11-05 DIAGNOSIS — Z1331 Encounter for screening for depression: Secondary | ICD-10-CM | POA: Diagnosis not present

## 2020-11-05 DIAGNOSIS — Z23 Encounter for immunization: Secondary | ICD-10-CM | POA: Diagnosis not present

## 2020-11-13 DIAGNOSIS — H524 Presbyopia: Secondary | ICD-10-CM | POA: Diagnosis not present

## 2020-11-13 DIAGNOSIS — H5213 Myopia, bilateral: Secondary | ICD-10-CM | POA: Diagnosis not present

## 2020-11-14 DIAGNOSIS — M25562 Pain in left knee: Secondary | ICD-10-CM | POA: Diagnosis not present

## 2020-11-20 DIAGNOSIS — R269 Unspecified abnormalities of gait and mobility: Secondary | ICD-10-CM | POA: Diagnosis not present

## 2020-11-20 DIAGNOSIS — G4486 Cervicogenic headache: Secondary | ICD-10-CM | POA: Diagnosis not present

## 2020-11-20 DIAGNOSIS — M25551 Pain in right hip: Secondary | ICD-10-CM | POA: Diagnosis not present

## 2020-11-20 DIAGNOSIS — M25562 Pain in left knee: Secondary | ICD-10-CM | POA: Diagnosis not present

## 2020-12-05 DIAGNOSIS — G4486 Cervicogenic headache: Secondary | ICD-10-CM | POA: Diagnosis not present

## 2020-12-05 DIAGNOSIS — R269 Unspecified abnormalities of gait and mobility: Secondary | ICD-10-CM | POA: Diagnosis not present

## 2020-12-05 DIAGNOSIS — M25562 Pain in left knee: Secondary | ICD-10-CM | POA: Diagnosis not present

## 2020-12-05 DIAGNOSIS — M25551 Pain in right hip: Secondary | ICD-10-CM | POA: Diagnosis not present

## 2020-12-10 DIAGNOSIS — L814 Other melanin hyperpigmentation: Secondary | ICD-10-CM | POA: Diagnosis not present

## 2020-12-10 DIAGNOSIS — D2261 Melanocytic nevi of right upper limb, including shoulder: Secondary | ICD-10-CM | POA: Diagnosis not present

## 2020-12-10 DIAGNOSIS — D225 Melanocytic nevi of trunk: Secondary | ICD-10-CM | POA: Diagnosis not present

## 2020-12-10 DIAGNOSIS — D2262 Melanocytic nevi of left upper limb, including shoulder: Secondary | ICD-10-CM | POA: Diagnosis not present

## 2020-12-24 DIAGNOSIS — M25562 Pain in left knee: Secondary | ICD-10-CM | POA: Diagnosis not present

## 2020-12-24 DIAGNOSIS — G4486 Cervicogenic headache: Secondary | ICD-10-CM | POA: Diagnosis not present

## 2020-12-24 DIAGNOSIS — R269 Unspecified abnormalities of gait and mobility: Secondary | ICD-10-CM | POA: Diagnosis not present

## 2020-12-24 DIAGNOSIS — M25551 Pain in right hip: Secondary | ICD-10-CM | POA: Diagnosis not present

## 2021-01-17 DIAGNOSIS — Z23 Encounter for immunization: Secondary | ICD-10-CM | POA: Diagnosis not present

## 2021-02-12 DIAGNOSIS — J029 Acute pharyngitis, unspecified: Secondary | ICD-10-CM | POA: Diagnosis not present

## 2021-02-12 DIAGNOSIS — J069 Acute upper respiratory infection, unspecified: Secondary | ICD-10-CM | POA: Diagnosis not present

## 2021-02-12 DIAGNOSIS — R059 Cough, unspecified: Secondary | ICD-10-CM | POA: Diagnosis not present

## 2021-04-07 DIAGNOSIS — R102 Pelvic and perineal pain: Secondary | ICD-10-CM | POA: Diagnosis not present

## 2021-04-10 ENCOUNTER — Telehealth: Payer: Self-pay | Admitting: Internal Medicine

## 2021-04-10 NOTE — Telephone Encounter (Signed)
Patient called me to report issues with constipation of late.  She normally takes Linzess to 90 mcg twice weekly.  She has been using this more frequently and it has not been helpful.  She has been having right lower quadrant discomfort, some left lower quadrant discomfort.  No fever.  No chills.  No nausea or vomiting.  She has a shooting pain and pressure in the rectum which seems to be worsening.  No bleeding.  She had colonoscopy 4 and half years ago which was normal with Dr. Earlean Shawl  She does have a history of chronic constipation  Plan is bowel prep purge tonight, trial of Trulance 3 mg daily  If not improving and complete resolution of symptoms then we will consider repeating colonoscopy and also consider cross-sectional imaging.

## 2021-04-14 DIAGNOSIS — R269 Unspecified abnormalities of gait and mobility: Secondary | ICD-10-CM | POA: Diagnosis not present

## 2021-04-14 DIAGNOSIS — M25551 Pain in right hip: Secondary | ICD-10-CM | POA: Diagnosis not present

## 2021-04-15 ENCOUNTER — Other Ambulatory Visit: Payer: Self-pay | Admitting: Internal Medicine

## 2021-04-15 DIAGNOSIS — K5909 Other constipation: Secondary | ICD-10-CM

## 2021-04-15 DIAGNOSIS — K581 Irritable bowel syndrome with constipation: Secondary | ICD-10-CM

## 2021-04-15 MED ORDER — TRULANCE 3 MG PO TABS: 3.0000 mg | ORAL_TABLET | Freq: Every day | ORAL | 3 refills | Status: DC

## 2021-04-15 NOTE — Progress Notes (Signed)
Contacted by patient.  Abdominal pain and rectal pain has resolved after bowel purge using Plenvu Trulance samples working very well and she requests a prescription Will give 90 days with 3 refills She is aware she is due for screening colonoscopy later this year for her family history of colon polyps

## 2021-04-22 ENCOUNTER — Ambulatory Visit: Payer: BC Managed Care – PPO | Admitting: Nurse Practitioner

## 2021-04-28 DIAGNOSIS — M25551 Pain in right hip: Secondary | ICD-10-CM | POA: Diagnosis not present

## 2021-04-28 DIAGNOSIS — R269 Unspecified abnormalities of gait and mobility: Secondary | ICD-10-CM | POA: Diagnosis not present

## 2021-05-16 ENCOUNTER — Other Ambulatory Visit: Payer: Self-pay | Admitting: Obstetrics and Gynecology

## 2021-05-16 DIAGNOSIS — Z1231 Encounter for screening mammogram for malignant neoplasm of breast: Secondary | ICD-10-CM

## 2021-05-27 DIAGNOSIS — Z01419 Encounter for gynecological examination (general) (routine) without abnormal findings: Secondary | ICD-10-CM | POA: Diagnosis not present

## 2021-05-27 DIAGNOSIS — Z681 Body mass index (BMI) 19 or less, adult: Secondary | ICD-10-CM | POA: Diagnosis not present

## 2021-05-27 DIAGNOSIS — Z124 Encounter for screening for malignant neoplasm of cervix: Secondary | ICD-10-CM | POA: Diagnosis not present

## 2021-06-25 ENCOUNTER — Ambulatory Visit
Admission: RE | Admit: 2021-06-25 | Discharge: 2021-06-25 | Disposition: A | Discharge status: Home / Self Care | Payer: BC Managed Care – PPO | Source: Ambulatory Visit | Attending: Obstetrics and Gynecology | Admitting: Obstetrics and Gynecology

## 2021-06-25 DIAGNOSIS — Z1231 Encounter for screening mammogram for malignant neoplasm of breast: Secondary | ICD-10-CM

## 2021-07-22 ENCOUNTER — Encounter: Payer: Self-pay | Admitting: Internal Medicine

## 2021-09-01 ENCOUNTER — Ambulatory Visit (AMBULATORY_SURGERY_CENTER): Payer: BC Managed Care – PPO | Admitting: *Deleted

## 2021-09-01 VITALS — Ht 68.5 in | Wt 121.0 lb

## 2021-09-01 DIAGNOSIS — D132 Benign neoplasm of duodenum: Secondary | ICD-10-CM

## 2021-09-01 DIAGNOSIS — K5909 Other constipation: Secondary | ICD-10-CM

## 2021-09-01 MED ORDER — NA SULFATE-K SULFATE-MG SULF 17.5-3.13-1.6 GM/177ML PO SOLN: 1.0000 | Freq: Once | ORAL | 0 refills | Status: AC

## 2021-09-01 NOTE — Progress Notes (Signed)
No egg or soy allergy known to patient  No issues known to pt with past sedation with any surgeries or procedures Patient denies ever being told they had issues or difficulty with intubation  No FH of Malignant Hyperthermia Pt is not on diet pills Pt is not on  home 02  Pt is not on blood thinners  Pt denies issues with constipation  No A fib or A flutter   NO PA's for preps discussed with pt In PV today  Discussed with pt there will be an out-of-pocket cost for prep and that varies from $0 to 70 +  dollars - pt verbalized understanding  Pt instructed to use Singlecare.com or GoodRx for a price reduction on prep

## 2021-09-14 ENCOUNTER — Encounter: Payer: Self-pay | Admitting: Certified Registered Nurse Anesthetist

## 2021-09-18 ENCOUNTER — Encounter: Payer: Self-pay | Admitting: Internal Medicine

## 2021-09-29 ENCOUNTER — Ambulatory Visit (AMBULATORY_SURGERY_CENTER): Payer: BC Managed Care – PPO | Admitting: Internal Medicine

## 2021-09-29 ENCOUNTER — Encounter: Payer: Self-pay | Admitting: Internal Medicine

## 2021-09-29 VITALS — BP 92/56 | HR 54 | Temp 97.3°F | Resp 16 | Ht 69.0 in | Wt 127.0 lb

## 2021-09-29 DIAGNOSIS — D132 Benign neoplasm of duodenum: Secondary | ICD-10-CM

## 2021-09-29 DIAGNOSIS — Z1211 Encounter for screening for malignant neoplasm of colon: Secondary | ICD-10-CM

## 2021-09-29 DIAGNOSIS — Z8371 Family history of colonic polyps: Secondary | ICD-10-CM

## 2021-09-29 DIAGNOSIS — D125 Benign neoplasm of sigmoid colon: Secondary | ICD-10-CM

## 2021-09-29 DIAGNOSIS — K635 Polyp of colon: Secondary | ICD-10-CM | POA: Diagnosis not present

## 2021-09-29 DIAGNOSIS — K3189 Other diseases of stomach and duodenum: Secondary | ICD-10-CM | POA: Diagnosis not present

## 2021-09-29 DIAGNOSIS — K449 Diaphragmatic hernia without obstruction or gangrene: Secondary | ICD-10-CM

## 2021-09-29 DIAGNOSIS — K317 Polyp of stomach and duodenum: Secondary | ICD-10-CM

## 2021-09-29 MED ORDER — SODIUM CHLORIDE 0.9 % IV SOLN: 500.0000 mL | INTRAVENOUS | Status: DC

## 2021-09-29 NOTE — Progress Notes (Signed)
Called to room to assist during endoscopic procedure.  Patient ID and intended procedure confirmed with present staff. Received instructions for my participation in the procedure from the performing physician.  

## 2021-09-29 NOTE — Op Note (Signed)
[place] Patient Name: Christy Porter Procedure Date: 09/29/2021 8:56 AM MRN: [id number] Endoscopist: Jerene Bears , MD Age: 51 Referring MD:  Date of Birth: 03-Mar-1971 Gender: Female Account #: [id number] Procedure:                Colonoscopy Indications:              Colon cancer screening in patient at increased                            risk: Family history of 1st-degree relatives with                            colon polyps before age 92 years (father, sister,                            brother), Last colonoscopy: 2018 Medicines:                Monitored Anesthesia Care Procedure:                Pre-Anesthesia Assessment:                           - Prior to the procedure, a History and Physical                            was performed, and patient medications and                            allergies were reviewed. The patient's tolerance of                            previous anesthesia was also reviewed. The risks                            and benefits of the procedure and the sedation                            options and risks were discussed with the patient.                            All questions were answered, and informed consent                            was obtained. Prior Anticoagulants: The patient has                            taken no previous anticoagulant or antiplatelet                            agents. ASA Grade Assessment: II - A patient with                            mild systemic disease. After reviewing the risks  and benefits, the patient was deemed in                            satisfactory condition to undergo the procedure.                           After obtaining informed consent, the colonoscope                            was passed under direct vision. Throughout the                            procedure, the patient's blood pressure, pulse, and                            oxygen saturations were monitored  continuously. The                            Olympus [id number]-[id number] (#[id number]) Colonoscope was                            introduced through the anus and advanced to the                            terminal ileum. The colonoscopy was performed                            without difficulty. The patient tolerated the                            procedure well. The quality of the bowel                            preparation was excellent. The terminal ileum,                            ileocecal valve, appendiceal orifice, and rectum                            were photographed. Scope In: 9:08:38 AM Scope Out: 9:24:09 AM Scope Withdrawal Time: 0 hours 11 minutes 56 seconds  Total Procedure Duration: 0 hours 15 minutes 31 seconds  Findings:                 The digital rectal exam was normal.                           A 2 mm polyp was found in the sigmoid colon. The                            polyp was sessile. The polyp was removed with a                            cold biopsy forceps. Resection and retrieval were  complete.                           External hemorrhoids were found during                            retroflexion. The hemorrhoids were small.                           The exam was otherwise without abnormality. Complications:            No immediate complications. Estimated Blood Loss:     Estimated blood loss: none. Impression:               - One 2 mm polyp in the sigmoid colon, removed with                            a cold biopsy forceps. Resected and retrieved.                           - Small external hemorrhoids.                           - The examination was otherwise normal. Recommendation:           - Patient has a contact number available for                            emergencies. The signs and symptoms of potential                            delayed complications were discussed with the                            patient. Return to normal  activities tomorrow.                            Written discharge instructions were provided to the                            patient.                           - Resume previous diet.                           - Continue present medications.                           - Await pathology results.                           - Repeat colonoscopy in 5 years for screening                            purposes. Jerene Bears, MD 09/29/2021 9:34:36 AM This report has been signed electronically.

## 2021-09-29 NOTE — Progress Notes (Signed)
0849 Robinul 0.1 mg IV given due large amount of secretions upon assessment.  MD made aware, vss

## 2021-09-29 NOTE — Progress Notes (Signed)
GASTROENTEROLOGY PROCEDURE H&P NOTE   Primary Care Physician: Shon Baton, MD    Reason for Procedure:  History of duodenal adenoma, family history of colon polyps  Plan:    Upper and lower endoscopy  Patient is appropriate for endoscopic procedure(s) in the ambulatory ([facility]) setting.  The nature of the procedure, as well as the risks, benefits, and alternatives were carefully and thoroughly reviewed with the patient. Ample time for discussion and questions allowed. The patient understood, was satisfied, and agreed to proceed.     HPI: Christy Porter is a 51 y.o. female who presents for EGD and colonoscopy.  Medical history as below.  Tolerated the prep.  No recent chest pain or shortness of breath.  No abdominal pain today.  Past Medical History:  Diagnosis Date   Colon polyps    Family history of colonic polyps    GERD (gastroesophageal reflux disease)    HELLP syndrome    Hypertension    Migraine without aura, without mention of intractable migraine without mention of status migrainosus 02/13/2013   Personal history of colonic polyps    PONV (postoperative nausea and vomiting)    Vitamin D deficiency    past hx    Past Surgical History:  Procedure Laterality Date   BREAST BIOPSY Left 09/2014   BREAST EXCISIONAL BIOPSY Left 11/2014   BREAST LUMPECTOMY WITH RADIOACTIVE SEED LOCALIZATION Left 12/07/2014   Procedure: LEFT BREAST LUMPECTOMY WITH RADIOACTIVE SEED LOCALIZATION AND LEFT CENTRAL DUCT EXCISION;  Surgeon: Erroll Luna, MD;  Location: [facility];  Service: General;  Laterality: Left;   BREAST REDUCTION SURGERY  2000   CESAREAN SECTION  '08   2008,2012   perianal skin excision     vaginal ablation      Prior to Admission medications   Medication Sig Start Date End Date Taking? Authorizing Provider  cholecalciferol (VITAMIN D3) 25 MCG (1000 UNIT) tablet Take 2,000 Units by mouth daily.   Yes [provider]  hydrochlorothiazide 25  MG tablet Take 25 mg by mouth daily.     Yes [provider]  LINZESS 290 MCG CAPS capsule Take 290 mcg by mouth as needed. Uses 2-3 x a week 08/28/21  Yes [provider]  LO LOESTRIN FE 1 MG-10 MCG / 10 MCG tablet Take 1 tablet by mouth daily. 06/28/21  Yes [provider]  zaleplon (SONATA) 5 MG capsule Take 5 mg by mouth at bedtime. 08/07/21  Yes [provider]  albuterol (VENTOLIN HFA) 108 (90 Base) MCG/ACT inhaler SMARTSIG:2 Puff(s) By Mouth Every 4 Hours PRN 08/29/21   [provider]  famotidine (PEPCID) 20 MG tablet Take 20 mg by mouth as needed for heartburn or indigestion. Doubles up as needed    [provider]    Current Outpatient Medications  Medication Sig Dispense Refill   cholecalciferol (VITAMIN D3) 25 MCG (1000 UNIT) tablet Take 2,000 Units by mouth daily.     hydrochlorothiazide 25 MG tablet Take 25 mg by mouth daily.       LINZESS 290 MCG CAPS capsule Take 290 mcg by mouth as needed. Uses 2-3 x a week     LO LOESTRIN FE 1 MG-10 MCG / 10 MCG tablet Take 1 tablet by mouth daily.     zaleplon (SONATA) 5 MG capsule Take 5 mg by mouth at bedtime.     albuterol (VENTOLIN HFA) 108 (90 Base) MCG/ACT inhaler SMARTSIG:2 Puff(s) By Mouth Every 4 Hours PRN  famotidine (PEPCID) 20 MG tablet Take 20 mg by mouth as needed for heartburn or indigestion. Doubles up as needed     Current Facility-Administered Medications  Medication Dose Route Frequency Provider Last Rate Last Admin   0.9 %  sodium chloride infusion  500 mL Intravenous Continuous Margretta Zamorano, Lajuan Lines, MD        Allergies as of 09/29/2021   (No Known Allergies)    Family History  Problem Relation Age of Onset   Hypertension Mother        Living   Migraines Mother    Irritable bowel syndrome Mother    Lupus Maternal Grandmother    Healthy Father        Living   Colon polyps Father        between 56-20 polyps   Colon polyps Sister        3-5 polyps   Irritable bowel  syndrome Sister    Colon polyps Brother        7 polyps   Prostate cancer Maternal Grandfather 68   Colon cancer Neg Hx    Esophageal cancer Neg Hx    Stomach cancer Neg Hx    Pancreatic cancer Neg Hx    Liver disease Neg Hx     Social History   Socioeconomic History   Marital status: Married    Spouse name: Leory Plowman   Number of children: 2   Years of education: College   Highest education level: Not on file  Occupational History    Employer: [employer]    Comment: Pt. works for [employer]  Tobacco Use   Smoking status: Never   Smokeless tobacco: Never  Vaping Use   Vaping Use: Never used  Substance and Sexual Activity   Alcohol use: Yes    Comment: Drinks alcohol on occasion   Drug use: No   Sexual activity: Not on file  Other Topics Concern   Not on file  Social History Narrative   Works as a Scientist, research (life sciences) Physiological scientist).  Lives with husband and two children.   Caffeine Use: 3-4 Diet Cokes daily   Social Determinants of Health   Financial Resource Strain: Not on file  Food Insecurity: Not on file  Transportation Needs: Not on file  Physical Activity: Not on file  Stress: Not on file  Social Connections: Not on file  Intimate Partner Violence: Not on file    Physical Exam: Vital signs in last 24 hours: '@BP'$  114/70   Pulse (!) 55   Temp (!) 97.3 F (36.3 C)   Resp 14   Ht '5\' 9"'$  (1.753 m)   Wt 127 lb (57.6 kg)   SpO2 100%   BMI 18.75 kg/m  GEN: NAD EYE: Sclerae anicteric ENT: MMM CV: Non-tachycardic Pulm: CTA b/l GI: Soft, NT/ND NEURO:  Alert & Oriented x 3   Christy Jarred, MD [facility]  09/29/2021 8:57 AM

## 2021-09-29 NOTE — Progress Notes (Signed)
Report given to PACU, vss 

## 2021-09-29 NOTE — Patient Instructions (Signed)
YOU HAD AN ENDOSCOPIC PROCEDURE TODAY AT THE [facility]:   Refer to the procedure report that was given to you for any specific questions about what was found during the examination.  If the procedure report does not answer your questions, please call your gastroenterologist to clarify.  If you requested that your care partner not be given the details of your procedure findings, then the procedure report has been included in a sealed envelope for you to review at your convenience later.  YOU SHOULD EXPECT: Some feelings of bloating in the abdomen. Passage of more gas than usual.  Walking can help get rid of the air that was put into your GI tract during the procedure and reduce the bloating. If you had a lower endoscopy (such as a colonoscopy or flexible sigmoidoscopy) you may notice spotting of blood in your stool or on the toilet paper. If you underwent a bowel prep for your procedure, you may not have a normal bowel movement for a few days.  Please Note:  You might notice some irritation and congestion in your nose or some drainage.  This is from the oxygen used during your procedure.  There is no need for concern and it should clear up in a day or so.  SYMPTOMS TO REPORT IMMEDIATELY:  Following lower endoscopy (colonoscopy or flexible sigmoidoscopy):  Excessive amounts of blood in the stool  Significant tenderness or worsening of abdominal pains  Swelling of the abdomen that is new, acute  Fever of 100F or higher  Following upper endoscopy (EGD)  Vomiting of blood or coffee ground material  New chest pain or pain under the shoulder blades  Painful or persistently difficult swallowing  New shortness of breath  Fever of 100F or higher  Black, tarry-looking stools  For urgent or emergent issues, a gastroenterologist can be reached at any hour by calling [phone]. Do not use MyChart messaging for urgent concerns.    DIET:  We do recommend a small meal at first, but  then you may proceed to your regular diet.  Drink plenty of fluids but you should avoid alcoholic beverages for 24 hours.  ACTIVITY:  You should plan to take it easy for the rest of today and you should NOT DRIVE or use heavy machinery until tomorrow (because of the sedation medicines used during the test).    FOLLOW UP: Our staff will call the number listed on your records the next business day following your procedure.  We will call around 7:15- 8:00 am to check on you and address any questions or concerns that you may have regarding the information given to you following your procedure. If we do not reach you, we will leave a message.  If you develop any symptoms (ie: fever, flu-like symptoms, shortness of breath, cough etc.) before then, please call [phone].  If you test positive for Covid 19 in the 2 weeks post procedure, please call and report this information to us.    If any biopsies were taken you will be contacted by phone or by letter within the next 1-3 weeks.  Please call us at [phone] if you have not heard about the biopsies in 3 weeks.    SIGNATURES/CONFIDENTIALITY: You and/or your care partner have signed paperwork which will be entered into your electronic medical record.  These signatures attest to the fact that that the information above on your After Visit Summary has been reviewed and is understood.  Full responsibility of the confidentiality   of this discharge information lies with you and/or your care-partner.  

## 2021-09-29 NOTE — Op Note (Signed)
[name] Patient Name: Christy Porter Procedure Date: 09/29/2021 8:56 AM MRN: [id number] Endoscopist: Jerene Bears , MD Age: 51 Referring MD:  Date of Birth: 1970-05-19 Gender: Female Account #: [id number] Procedure:                Upper GI endoscopy Indications:              Follow-up of polyp in the duodenum (adenoma)                            removed in 2020; GERD established (currently PRN                            famotidine) Medicines:                Monitored Anesthesia Care Procedure:                Pre-Anesthesia Assessment:                           - Prior to the procedure, a History and Physical                            was performed, and patient medications and                            allergies were reviewed. The patient's tolerance of                            previous anesthesia was also reviewed. The risks                            and benefits of the procedure and the sedation                            options and risks were discussed with the patient.                            All questions were answered, and informed consent                            was obtained. Prior Anticoagulants: The patient has                            taken no previous anticoagulant or antiplatelet                            agents. ASA Grade Assessment: II - A patient with                            mild systemic disease. After reviewing the risks                            and benefits, the patient was deemed in  satisfactory condition to undergo the procedure.                           After obtaining informed consent, the endoscope was                            passed under direct vision. Throughout the                            procedure, the patient's blood pressure, pulse, and                            oxygen saturations were monitored continuously. The                            GIF [id number] #[id number] was introduced through the                             mouth, and advanced to the second part of duodenum.                            The upper GI endoscopy was accomplished without                            difficulty. The patient tolerated the procedure                            well. Scope In: Scope Out: Findings:                 Mild inflammation characterized by congestion                            (edema) was found in the lower third of the                            esophagus. Z-line is regular at 39 cm.                           A 1 cm hiatal hernia was present.                           The gastroesophageal flap valve was visualized                            endoscopically and classified as Hill Grade IV (no                            fold, wide open lumen, hiatal hernia present).                           The entire examined stomach was normal.                           A single 3 mm sessile polyp was  found in the                            duodenal bulb. The polyp was removed with a cold                            biopsy forceps. Resection and retrieval were                            complete.                           The exam of the duodenum was otherwise normal. Complications:            No immediate complications. Estimated Blood Loss:     Estimated blood loss: none. Impression:               - Esophageal mucosal changes were present,                            including congestion (edema). Findings are                            suggestive of mild reflux inflammation.                           - 1 cm hiatal hernia.                           - Normal stomach.                           - A single duodenal polyp. Resected and retrieved. Recommendation:           - Patient has a contact number available for                            emergencies. The signs and symptoms of potential                            delayed complications were discussed with the                            patient. Return to  normal activities tomorrow.                            Written discharge instructions were provided to the                            patient.                           - Resume previous diet.                           - Continue present medications.                           -  Await pathology results.                           - Repeat upper endoscopy for surveillance based on                            pathology results. Jerene Bears, MD 09/29/2021 9:31:24 AM This report has been signed electronically.

## 2021-09-30 ENCOUNTER — Telehealth: Payer: Self-pay

## 2021-09-30 NOTE — Telephone Encounter (Signed)
  Follow up Call-     09/29/2021    7:46 AM  Call back number  Post procedure Call Back phone  # [phone]  Permission to leave phone message Yes     Patient questions:  Do you have a fever, pain , or abdominal swelling? No. Pain Score  0 *  Have you tolerated food without any problems? Yes.    Have you been able to return to your normal activities? Yes.    Do you have any questions about your discharge instructions: Diet   No. Medications  No. Follow up visit  No.  Do you have questions or concerns about your Care? No.  Actions: * If pain score is 4 or above: No action needed, pain <4.

## 2021-10-01 ENCOUNTER — Encounter: Payer: Self-pay | Admitting: Internal Medicine

## 2021-10-30 DIAGNOSIS — E063 Autoimmune thyroiditis: Secondary | ICD-10-CM | POA: Diagnosis not present

## 2021-11-07 DIAGNOSIS — E063 Autoimmune thyroiditis: Secondary | ICD-10-CM | POA: Diagnosis not present

## 2021-11-07 DIAGNOSIS — Z8632 Personal history of gestational diabetes: Secondary | ICD-10-CM | POA: Diagnosis not present

## 2021-11-17 DIAGNOSIS — H5213 Myopia, bilateral: Secondary | ICD-10-CM | POA: Diagnosis not present

## 2021-11-19 ENCOUNTER — Ambulatory Visit (INDEPENDENT_AMBULATORY_CARE_PROVIDER_SITE_OTHER): Payer: BC Managed Care – PPO | Admitting: Family Medicine

## 2021-11-19 DIAGNOSIS — S39012A Strain of muscle, fascia and tendon of lower back, initial encounter: Secondary | ICD-10-CM

## 2021-11-19 MED ORDER — METHYLPREDNISOLONE ACETATE 40 MG/ML IJ SUSP
40.0000 mg | Freq: Once | INTRAMUSCULAR | Status: AC
Start: 2021-11-19 — End: 2021-11-19
  Administered 2021-11-19: 40 mg via INTRA_ARTICULAR

## 2021-11-19 MED ORDER — KETOROLAC TROMETHAMINE 30 MG/ML IJ SOLN
30.0000 mg | Freq: Once | INTRAMUSCULAR | Status: AC
  Administered 2021-11-19: 30 mg via INTRA_ARTICULAR

## 2021-11-19 NOTE — Assessment & Plan Note (Signed)
Acutely occurring.  Symptoms most consistent with a strain.  No radicular pain and having some stiffness on exam. -Counseled on home exercise therapy and supportive care. -IM Toradol and Depo-Medrol. -Consider physical therapy or further imaging.

## 2021-11-19 NOTE — Progress Notes (Signed)
  Christy Porter - 51 y.o. female MRN [id number]  Date of birth: September 03, 1970  SUBJECTIVE:  Including CC & ROS.  No chief complaint on file.   Christy Porter is a 51 y.o. female that is  presenting with acute right low back pain. Has been ongoing for one day. No radicular pain. No history of surgery.    Review of Systems See HPI   HISTORY: Past Medical, Surgical, Social, and Family History Reviewed & Updated per EMR.   Pertinent Historical Findings include:  Past Medical History:  Diagnosis Date   Colon polyps    Family history of colonic polyps    GERD (gastroesophageal reflux disease)    HELLP syndrome    Hypertension    Migraine without aura, without mention of intractable migraine without mention of status migrainosus 02/13/2013   Personal history of colonic polyps    PONV (postoperative nausea and vomiting)    Vitamin D deficiency    past hx    Past Surgical History:  Procedure Laterality Date   BREAST BIOPSY Left 09/2014   BREAST EXCISIONAL BIOPSY Left 11/2014   BREAST LUMPECTOMY WITH RADIOACTIVE SEED LOCALIZATION Left 12/07/2014   Procedure: LEFT BREAST LUMPECTOMY WITH RADIOACTIVE SEED LOCALIZATION AND LEFT CENTRAL DUCT EXCISION;  Surgeon: Erroll Luna, MD;  Location: [facility];  Service: General;  Laterality: Left;   BREAST REDUCTION SURGERY  2000   CESAREAN SECTION  '08   2008,2012   perianal skin excision     vaginal ablation       PHYSICAL EXAM:  VS: Ht '5\' 9"'$  (1.753 m)   Wt 125 lb (56.7 kg)   BMI 18.46 kg/m  Physical Exam Gen: NAD, alert, cooperative with exam, well-appearing MSK:  Neurovascularly intact       ASSESSMENT & PLAN:   Strain of lumbar region Acutely occurring.  Symptoms most consistent with a strain.  No radicular pain and having some stiffness on exam. -Counseled on home exercise therapy and supportive care. -IM Toradol and Depo-Medrol. -Consider physical therapy or further imaging.

## 2021-11-21 ENCOUNTER — Ambulatory Visit: Payer: BC Managed Care – PPO

## 2021-12-04 DIAGNOSIS — R7989 Other specified abnormal findings of blood chemistry: Secondary | ICD-10-CM | POA: Diagnosis not present

## 2021-12-04 DIAGNOSIS — Z0000 Encounter for general adult medical examination without abnormal findings: Secondary | ICD-10-CM | POA: Diagnosis not present

## 2021-12-04 DIAGNOSIS — E559 Vitamin D deficiency, unspecified: Secondary | ICD-10-CM | POA: Diagnosis not present

## 2021-12-09 DIAGNOSIS — E559 Vitamin D deficiency, unspecified: Secondary | ICD-10-CM | POA: Diagnosis not present

## 2021-12-09 DIAGNOSIS — Z0000 Encounter for general adult medical examination without abnormal findings: Secondary | ICD-10-CM | POA: Diagnosis not present

## 2021-12-16 DIAGNOSIS — L570 Actinic keratosis: Secondary | ICD-10-CM | POA: Diagnosis not present

## 2021-12-16 DIAGNOSIS — B078 Other viral warts: Secondary | ICD-10-CM | POA: Diagnosis not present

## 2021-12-16 DIAGNOSIS — D225 Melanocytic nevi of trunk: Secondary | ICD-10-CM | POA: Diagnosis not present

## 2021-12-16 DIAGNOSIS — L821 Other seborrheic keratosis: Secondary | ICD-10-CM | POA: Diagnosis not present

## 2021-12-25 DIAGNOSIS — R87610 Atypical squamous cells of undetermined significance on cytologic smear of cervix (ASC-US): Secondary | ICD-10-CM | POA: Diagnosis not present

## 2022-01-03 DIAGNOSIS — Z23 Encounter for immunization: Secondary | ICD-10-CM | POA: Diagnosis not present

## 2022-03-19 ENCOUNTER — Encounter: Payer: Self-pay | Admitting: Internal Medicine

## 2022-03-20 ENCOUNTER — Other Ambulatory Visit: Payer: Self-pay

## 2022-03-20 MED ORDER — MOTEGRITY 2 MG PO TABS: 2.0000 mg | ORAL_TABLET | Freq: Every day | ORAL | 3 refills | Status: AC

## 2022-03-20 NOTE — Telephone Encounter (Signed)
Let Ronnetta know fine to try motegrity Good option for her  D/c linzess Motegrity 2 mg daily Thanks JMP

## 2022-04-02 DIAGNOSIS — H6992 Unspecified Eustachian tube disorder, left ear: Secondary | ICD-10-CM | POA: Diagnosis not present

## 2022-05-13 ENCOUNTER — Other Ambulatory Visit: Payer: Self-pay | Admitting: Obstetrics and Gynecology

## 2022-05-13 DIAGNOSIS — Z1231 Encounter for screening mammogram for malignant neoplasm of breast: Secondary | ICD-10-CM

## 2022-05-19 DIAGNOSIS — B078 Other viral warts: Secondary | ICD-10-CM | POA: Diagnosis not present

## 2022-05-19 DIAGNOSIS — L570 Actinic keratosis: Secondary | ICD-10-CM | POA: Diagnosis not present

## 2022-06-08 DIAGNOSIS — Z111 Encounter for screening for respiratory tuberculosis: Secondary | ICD-10-CM | POA: Diagnosis not present

## 2022-06-29 ENCOUNTER — Ambulatory Visit: Payer: BC Managed Care – PPO

## 2022-07-02 DIAGNOSIS — Z01419 Encounter for gynecological examination (general) (routine) without abnormal findings: Secondary | ICD-10-CM | POA: Diagnosis not present

## 2022-07-02 DIAGNOSIS — Z124 Encounter for screening for malignant neoplasm of cervix: Secondary | ICD-10-CM | POA: Diagnosis not present

## 2022-07-02 DIAGNOSIS — E063 Autoimmune thyroiditis: Secondary | ICD-10-CM | POA: Diagnosis not present

## 2022-07-02 DIAGNOSIS — Z681 Body mass index (BMI) 19 or less, adult: Secondary | ICD-10-CM | POA: Diagnosis not present

## 2022-07-06 ENCOUNTER — Encounter: Payer: Self-pay | Admitting: *Deleted

## 2022-07-16 DIAGNOSIS — M25551 Pain in right hip: Secondary | ICD-10-CM | POA: Diagnosis not present

## 2022-07-28 ENCOUNTER — Ambulatory Visit
Admission: RE | Admit: 2022-07-28 | Discharge: 2022-07-28 | Disposition: A | Discharge status: Home / Self Care | Payer: BC Managed Care – PPO | Source: Ambulatory Visit | Attending: Obstetrics and Gynecology | Admitting: Obstetrics and Gynecology

## 2022-07-28 DIAGNOSIS — Z1231 Encounter for screening mammogram for malignant neoplasm of breast: Secondary | ICD-10-CM

## 2022-11-02 DIAGNOSIS — Z131 Encounter for screening for diabetes mellitus: Secondary | ICD-10-CM | POA: Diagnosis not present

## 2022-11-02 DIAGNOSIS — E063 Autoimmune thyroiditis: Secondary | ICD-10-CM | POA: Diagnosis not present

## 2022-11-06 DIAGNOSIS — Z8632 Personal history of gestational diabetes: Secondary | ICD-10-CM | POA: Diagnosis not present

## 2022-11-06 DIAGNOSIS — E063 Autoimmune thyroiditis: Secondary | ICD-10-CM | POA: Diagnosis not present

## 2022-11-19 DIAGNOSIS — H5213 Myopia, bilateral: Secondary | ICD-10-CM | POA: Diagnosis not present

## 2022-12-17 DIAGNOSIS — L821 Other seborrheic keratosis: Secondary | ICD-10-CM | POA: Diagnosis not present

## 2022-12-17 DIAGNOSIS — D2262 Melanocytic nevi of left upper limb, including shoulder: Secondary | ICD-10-CM | POA: Diagnosis not present

## 2022-12-17 DIAGNOSIS — D225 Melanocytic nevi of trunk: Secondary | ICD-10-CM | POA: Diagnosis not present

## 2022-12-17 DIAGNOSIS — D2239 Melanocytic nevi of other parts of face: Secondary | ICD-10-CM | POA: Diagnosis not present

## 2023-01-08 DIAGNOSIS — E079 Disorder of thyroid, unspecified: Secondary | ICD-10-CM | POA: Diagnosis not present

## 2023-01-08 DIAGNOSIS — E559 Vitamin D deficiency, unspecified: Secondary | ICD-10-CM | POA: Diagnosis not present

## 2023-01-08 DIAGNOSIS — D649 Anemia, unspecified: Secondary | ICD-10-CM | POA: Diagnosis not present

## 2023-01-08 DIAGNOSIS — Z1212 Encounter for screening for malignant neoplasm of rectum: Secondary | ICD-10-CM | POA: Diagnosis not present

## 2023-01-18 DIAGNOSIS — Z0000 Encounter for general adult medical examination without abnormal findings: Secondary | ICD-10-CM | POA: Diagnosis not present

## 2023-01-18 DIAGNOSIS — R82998 Other abnormal findings in urine: Secondary | ICD-10-CM | POA: Diagnosis not present

## 2023-01-18 DIAGNOSIS — E876 Hypokalemia: Secondary | ICD-10-CM | POA: Diagnosis not present

## 2023-04-09 ENCOUNTER — Other Ambulatory Visit: Payer: Self-pay | Admitting: Obstetrics and Gynecology

## 2023-04-09 DIAGNOSIS — Z803 Family history of malignant neoplasm of breast: Secondary | ICD-10-CM

## 2023-04-19 ENCOUNTER — Other Ambulatory Visit (HOSPITAL_BASED_OUTPATIENT_CLINIC_OR_DEPARTMENT_OTHER): Payer: Self-pay

## 2023-04-19 MED ORDER — OSELTAMIVIR PHOSPHATE 75 MG PO CAPS
75.0000 mg | ORAL_CAPSULE | Freq: Every day | ORAL | 0 refills | Status: AC
  Filled 2023-04-19: qty 10, 10d supply, fill #0

## 2023-05-19 ENCOUNTER — Ambulatory Visit
Admission: RE | Admit: 2023-05-19 | Discharge: 2023-05-19 | Disposition: A | Discharge status: Home / Self Care | Payer: BC Managed Care – PPO | Source: Ambulatory Visit | Attending: Obstetrics and Gynecology | Admitting: Obstetrics and Gynecology

## 2023-05-19 DIAGNOSIS — Z803 Family history of malignant neoplasm of breast: Secondary | ICD-10-CM | POA: Diagnosis not present

## 2023-05-19 DIAGNOSIS — H10412 Chronic giant papillary conjunctivitis, left eye: Secondary | ICD-10-CM | POA: Diagnosis not present

## 2023-05-19 DIAGNOSIS — H2000 Unspecified acute and subacute iridocyclitis: Secondary | ICD-10-CM | POA: Diagnosis not present

## 2023-05-19 MED ORDER — GADOPICLENOL 0.5 MMOL/ML IV SOLN
6.0000 mL | Freq: Once | INTRAVENOUS | Status: AC | PRN
  Administered 2023-05-19: 6 mL via INTRAVENOUS

## 2023-06-11 DIAGNOSIS — R6884 Jaw pain: Secondary | ICD-10-CM | POA: Diagnosis not present

## 2023-06-17 DIAGNOSIS — R6884 Jaw pain: Secondary | ICD-10-CM | POA: Diagnosis not present

## 2023-06-22 DIAGNOSIS — R6884 Jaw pain: Secondary | ICD-10-CM | POA: Diagnosis not present

## 2023-06-29 DIAGNOSIS — R6884 Jaw pain: Secondary | ICD-10-CM | POA: Diagnosis not present

## 2023-07-13 DIAGNOSIS — R6884 Jaw pain: Secondary | ICD-10-CM | POA: Diagnosis not present

## 2023-07-20 DIAGNOSIS — Z681 Body mass index (BMI) 19 or less, adult: Secondary | ICD-10-CM | POA: Diagnosis not present

## 2023-07-20 DIAGNOSIS — R6884 Jaw pain: Secondary | ICD-10-CM | POA: Diagnosis not present

## 2023-07-20 DIAGNOSIS — Z01419 Encounter for gynecological examination (general) (routine) without abnormal findings: Secondary | ICD-10-CM | POA: Diagnosis not present

## 2023-07-20 DIAGNOSIS — Z124 Encounter for screening for malignant neoplasm of cervix: Secondary | ICD-10-CM | POA: Diagnosis not present

## 2023-07-21 ENCOUNTER — Other Ambulatory Visit: Payer: Self-pay | Admitting: Obstetrics and Gynecology

## 2023-07-21 DIAGNOSIS — Z1231 Encounter for screening mammogram for malignant neoplasm of breast: Secondary | ICD-10-CM

## 2023-08-30 ENCOUNTER — Ambulatory Visit

## 2023-09-08 ENCOUNTER — Ambulatory Visit
Admission: RE | Admit: 2023-09-08 | Discharge: 2023-09-08 | Disposition: A | Discharge status: Home / Self Care | Source: Ambulatory Visit | Attending: Obstetrics and Gynecology | Admitting: Obstetrics and Gynecology

## 2023-09-08 DIAGNOSIS — Z1231 Encounter for screening mammogram for malignant neoplasm of breast: Secondary | ICD-10-CM

## 2023-11-11 DIAGNOSIS — E063 Autoimmune thyroiditis: Secondary | ICD-10-CM | POA: Diagnosis not present

## 2023-11-11 DIAGNOSIS — Z8632 Personal history of gestational diabetes: Secondary | ICD-10-CM | POA: Diagnosis not present

## 2023-11-24 DIAGNOSIS — H5213 Myopia, bilateral: Secondary | ICD-10-CM | POA: Diagnosis not present

## 2023-11-26 DIAGNOSIS — M542 Cervicalgia: Secondary | ICD-10-CM | POA: Diagnosis not present

## 2023-11-26 DIAGNOSIS — R6884 Jaw pain: Secondary | ICD-10-CM | POA: Diagnosis not present

## 2023-12-01 DIAGNOSIS — M542 Cervicalgia: Secondary | ICD-10-CM | POA: Diagnosis not present

## 2023-12-01 DIAGNOSIS — R6884 Jaw pain: Secondary | ICD-10-CM | POA: Diagnosis not present

## 2023-12-21 DIAGNOSIS — D2261 Melanocytic nevi of right upper limb, including shoulder: Secondary | ICD-10-CM | POA: Diagnosis not present

## 2023-12-21 DIAGNOSIS — C44519 Basal cell carcinoma of skin of other part of trunk: Secondary | ICD-10-CM | POA: Diagnosis not present

## 2023-12-21 DIAGNOSIS — M542 Cervicalgia: Secondary | ICD-10-CM | POA: Diagnosis not present

## 2023-12-21 DIAGNOSIS — L570 Actinic keratosis: Secondary | ICD-10-CM | POA: Diagnosis not present

## 2023-12-21 DIAGNOSIS — D2262 Melanocytic nevi of left upper limb, including shoulder: Secondary | ICD-10-CM | POA: Diagnosis not present

## 2023-12-21 DIAGNOSIS — R6884 Jaw pain: Secondary | ICD-10-CM | POA: Diagnosis not present

## 2023-12-21 DIAGNOSIS — D225 Melanocytic nevi of trunk: Secondary | ICD-10-CM | POA: Diagnosis not present

## 2023-12-21 DIAGNOSIS — L821 Other seborrheic keratosis: Secondary | ICD-10-CM | POA: Diagnosis not present

## 2024-01-18 DIAGNOSIS — D649 Anemia, unspecified: Secondary | ICD-10-CM | POA: Diagnosis not present

## 2024-01-18 DIAGNOSIS — E559 Vitamin D deficiency, unspecified: Secondary | ICD-10-CM | POA: Diagnosis not present

## 2024-01-18 DIAGNOSIS — K219 Gastro-esophageal reflux disease without esophagitis: Secondary | ICD-10-CM | POA: Diagnosis not present

## 2024-01-18 DIAGNOSIS — E079 Disorder of thyroid, unspecified: Secondary | ICD-10-CM | POA: Diagnosis not present

## 2024-01-25 DIAGNOSIS — Z23 Encounter for immunization: Secondary | ICD-10-CM | POA: Diagnosis not present

## 2024-01-25 DIAGNOSIS — Z0000 Encounter for general adult medical examination without abnormal findings: Secondary | ICD-10-CM | POA: Diagnosis not present

## 2024-01-25 DIAGNOSIS — E876 Hypokalemia: Secondary | ICD-10-CM | POA: Diagnosis not present

## 2024-01-25 DIAGNOSIS — Z1331 Encounter for screening for depression: Secondary | ICD-10-CM | POA: Diagnosis not present

## 2024-02-01 ENCOUNTER — Encounter: Payer: Self-pay | Admitting: Radiology

## 2024-02-01 ENCOUNTER — Ambulatory Visit (INDEPENDENT_AMBULATORY_CARE_PROVIDER_SITE_OTHER): Admitting: Radiology

## 2024-02-01 VITALS — BP 100/64 | Ht 69.5 in | Wt 128.0 lb

## 2024-02-01 DIAGNOSIS — N951 Menopausal and female climacteric states: Secondary | ICD-10-CM | POA: Diagnosis not present

## 2024-02-01 MED ORDER — ESTRADIOL 0.0375 MG/24HR TD PTTW: 1.0000 | MEDICATED_PATCH | TRANSDERMAL | 1 refills | Status: DC

## 2024-02-01 MED ORDER — PROGESTERONE MICRONIZED 100 MG PO CAPS: 100.0000 mg | ORAL_CAPSULE | Freq: Every day | ORAL | 1 refills | Status: DC

## 2024-02-01 NOTE — Progress Notes (Signed)
   Christy Porter 1970/08/25 [id number]   History:  53 y.o. G3P2 presents as a new patient to discuss staring HRT. Previously seen by Dr Cook. She is s/p tubal ligation, endometrial ablation. Has BTB bleeding and was started on LoLoestrin continuously 8 years ago. She has noticed worsening brain fog, anxiety, mood changes and trouble sleeping. Currently using Sonata prn for sleep. Up to date with all screenings. Last AEX 06/2023 with Dr Cook. Hx of benign left breast lumpectomy in 2016 which showed a papilloma after having nipple d/c. Sister has a hx of ER+ breast cancer. Taking Neuro Mag once daily for brain function.  Gynecologic History No LMP recorded. Patient has had an ablation.   Contraception/Family planning: tubal ligation Sexually active: yes Last Pap: 4/25. Results were: normal Last mammogram: 09/08/23. Results were: normal  Obstetric History OB History  Gravida Para Term Preterm AB Living  3 2 1 1  2   SAB IAB Ectopic Multiple Live Births      1    # Outcome Date GA Lbr Len/2nd Weight Sex Type Anes PTL Lv  3 Term 10/24/10 [redacted]w[redacted]d  6 lb 7.7 oz (2.94 kg) F CS-LTranv Spinal  LIV     Birth Comments: Normal transition; no dysmorphic features.   2 Preterm           1 Gravida                The following portions of the patient's history were reviewed and updated as appropriate: allergies, current medications, past family history, past medical history, past social history, past surgical history, and problem list.  Review of Systems  All other systems reviewed and are negative.   Past medical history, past surgical history, family history and social history were all reviewed and documented in the EPIC chart.  Exam:  Vitals:   02/01/24 0941  BP: 100/64  Weight: 128 lb (58.1 kg)  Height: 5' 9.5 (1.765 m)   Body mass index is 18.63 kg/m.  Physical Exam Constitutional:      Appearance: Normal appearance.  Pulmonary:     Effort: Pulmonary effort is normal.   Neurological:     Mental Status: She is alert.  Psychiatric:        Mood and Affect: Mood normal.        Thought Content: Thought content normal.        Judgment: Judgment normal.      Christy Porter, CMA present for exam  Assessment/Plan:   1. Perimenopausal symptoms (Primary) Risks and benefits reviewed of HRT, will start with low dose.  Increase neuro mag to 3 times daily- may eliminate need for Linzess. - estradiol (VIVELLE-DOT) 0.0375 MG/24HR; Place 1 patch onto the skin 2 (two) times a week.  Dispense: 24 patch; Refill: 1 - progesterone (PROMETRIUM) 100 MG capsule; Take 1 capsule (100 mg total) by mouth daily.  Dispense: 90 capsule; Refill: 1    Return in about 12 weeks (around 04/25/2024) for Med Follow-up .  Christy Porter WHNP-BC 10:23 AM 02/01/2024

## 2024-02-10 ENCOUNTER — Other Ambulatory Visit: Payer: Self-pay | Admitting: Nurse Practitioner

## 2024-02-10 DIAGNOSIS — N941 Unspecified dyspareunia: Secondary | ICD-10-CM

## 2024-02-10 DIAGNOSIS — N951 Menopausal and female climacteric states: Secondary | ICD-10-CM

## 2024-02-10 MED ORDER — IMVEXXY MAINTENANCE PACK 10 MCG VA INST
1.0000 | VAGINAL_INSERT | VAGINAL | 5 refills | Status: AC
Start: 2024-02-10 — End: ?

## 2024-02-10 MED ORDER — IMVEXXY STARTER PACK 10 MCG VA INST: 1.0000 | VAGINAL_INSERT | Freq: Every evening | VAGINAL | 0 refills | Status: AC

## 2024-04-25 ENCOUNTER — Ambulatory Visit: Admitting: Radiology

## 2024-04-25 ENCOUNTER — Encounter: Payer: Self-pay | Admitting: Radiology

## 2024-04-25 VITALS — BP 102/74 | Wt 129.0 lb

## 2024-04-25 DIAGNOSIS — N951 Menopausal and female climacteric states: Secondary | ICD-10-CM

## 2024-04-25 DIAGNOSIS — G4709 Other insomnia: Secondary | ICD-10-CM | POA: Diagnosis not present

## 2024-04-25 MED ORDER — ESTRADIOL 0.075 MG/24HR TD PTTW: 1.0000 | MEDICATED_PATCH | TRANSDERMAL | 1 refills | Status: AC

## 2024-04-25 MED ORDER — PROGESTERONE MICRONIZED 100 MG PO CAPS: 200.0000 mg | ORAL_CAPSULE | Freq: Every evening | ORAL | 1 refills | Status: AC

## 2024-04-25 MED ORDER — ZALEPLON 5 MG PO CAPS: 5.0000 mg | ORAL_CAPSULE | Freq: Every day | ORAL | 0 refills | Status: AC

## 2024-07-25 ENCOUNTER — Ambulatory Visit: Admitting: Radiology
